# Patient Record
Sex: Female | Born: 1990
Health system: Southern US, Community
[De-identification: ages and names within clinical notes are randomized; demographics above are authoritative.]

## PROBLEM LIST (undated history)

## (undated) DIAGNOSIS — I10 Essential (primary) hypertension: Secondary | ICD-10-CM

## (undated) DIAGNOSIS — F99 Mental disorder, not otherwise specified: Secondary | ICD-10-CM

## (undated) HISTORY — DX: Mental disorder, not otherwise specified: F99

## (undated) HISTORY — DX: Essential (primary) hypertension: I10

---
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
## 2011-10-27 IMAGING — US US ABDOMEN COMPLETE
1 series · 14 of 25 positions shown · non-contrast
Comparison: None.

CLINICAL DATA: Right upper quadrant pain.  Nausea and vomiting.

COMPLETE ABDOMINAL ULTRASOUND

[Series 1: us abdomen complete · 0.26mm/px · 14 of 99 slices shown]
[im 1/99]
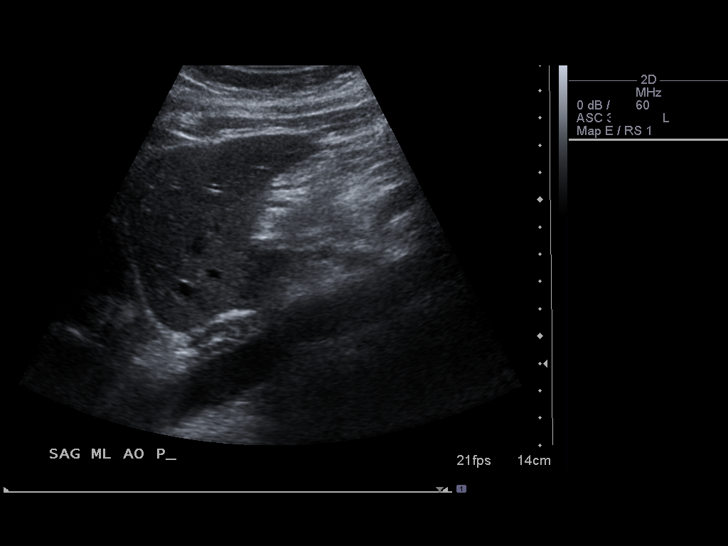
[im 9/99]
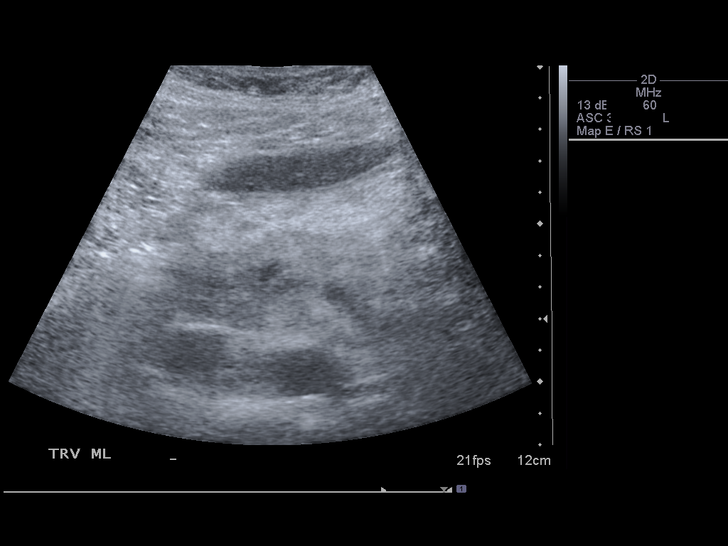
[im 17/99]
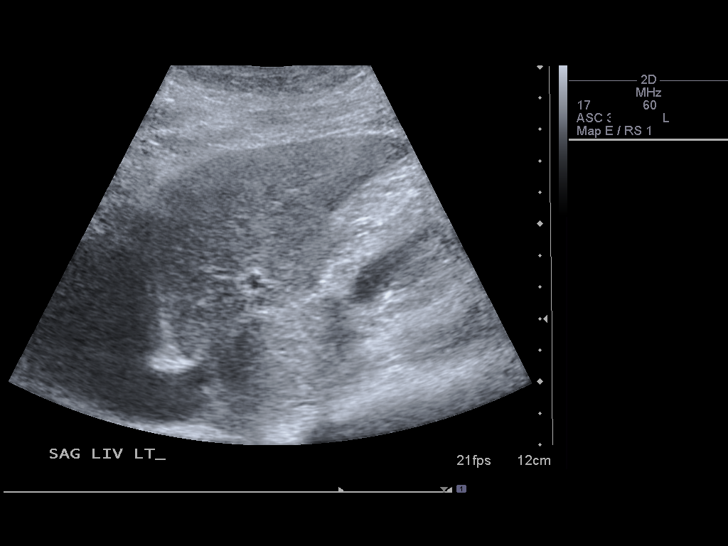
[im 25/99]
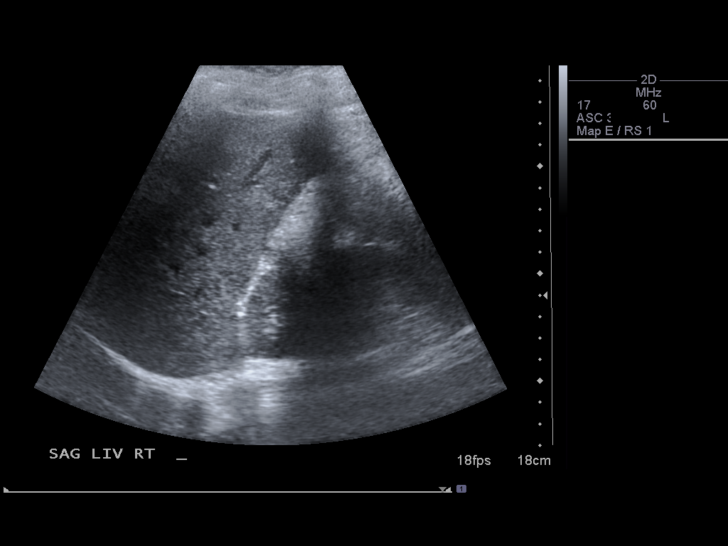
[im 33/99]
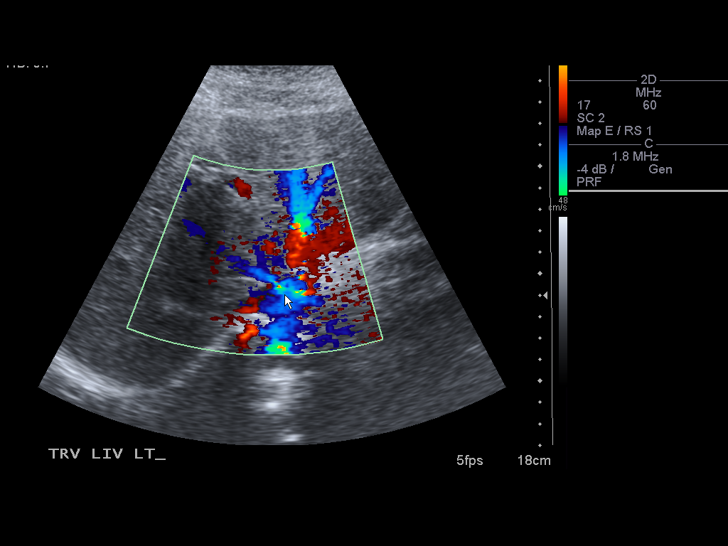
[im 37/99]
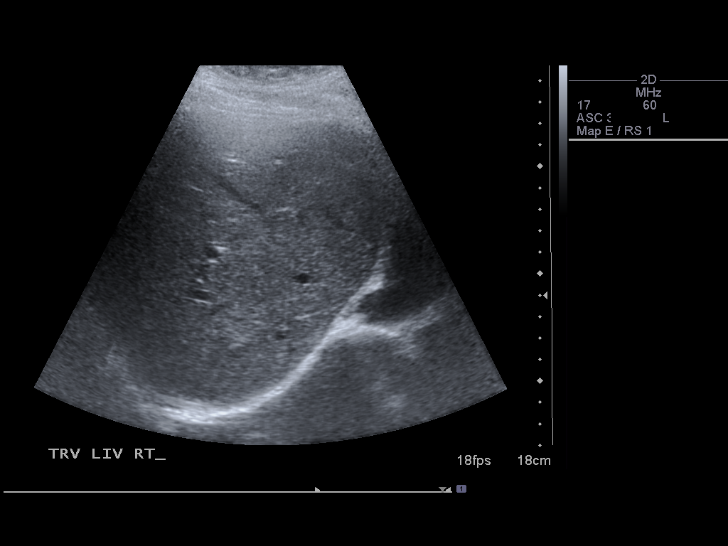
[im 45/99]
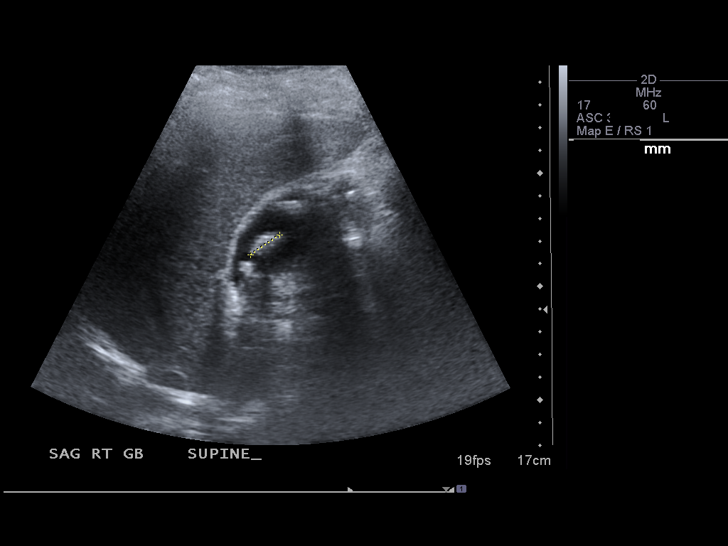
[im 54/99]
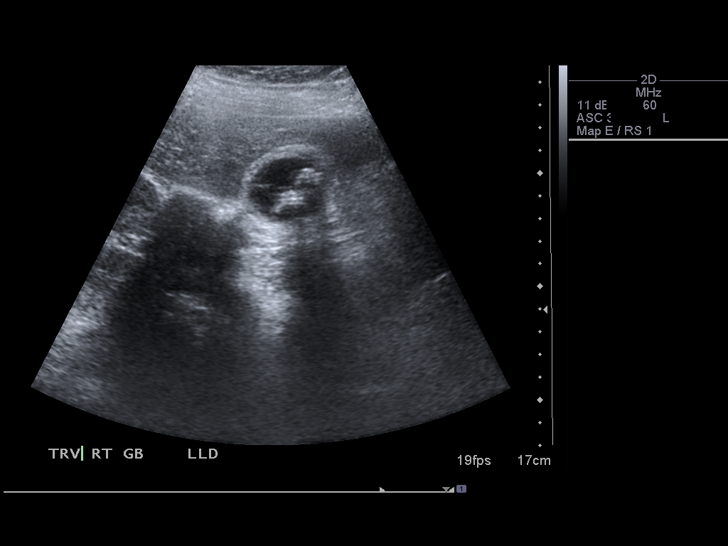
[im 62/99]
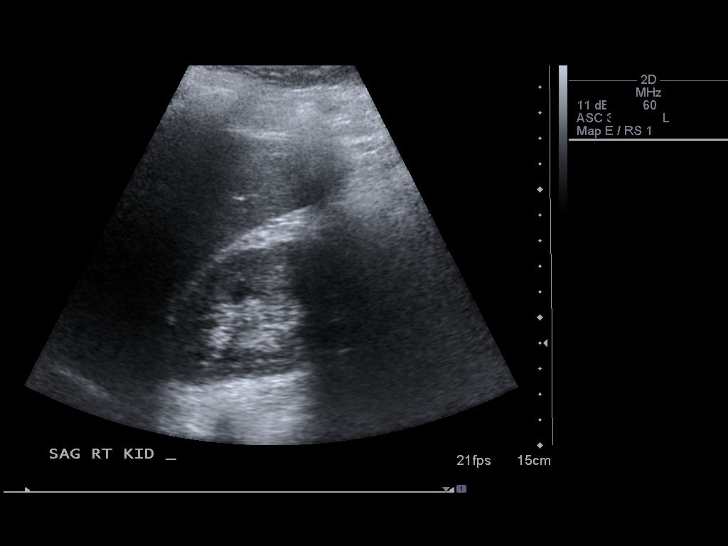
[im 66/99]
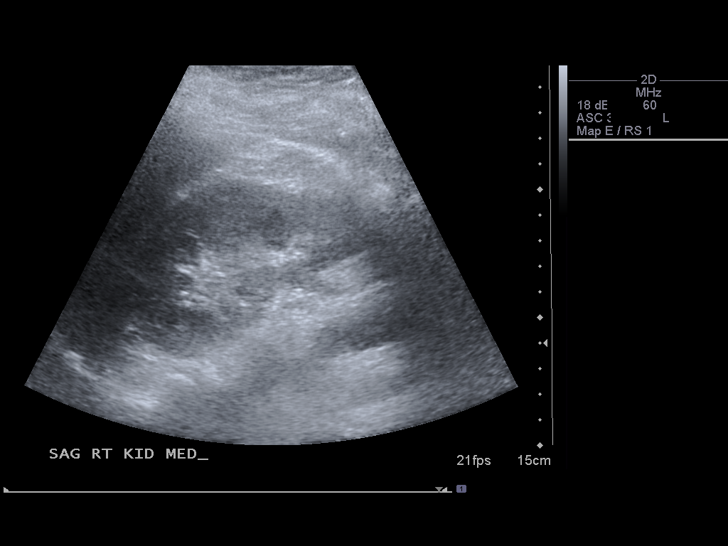
[im 74/99]
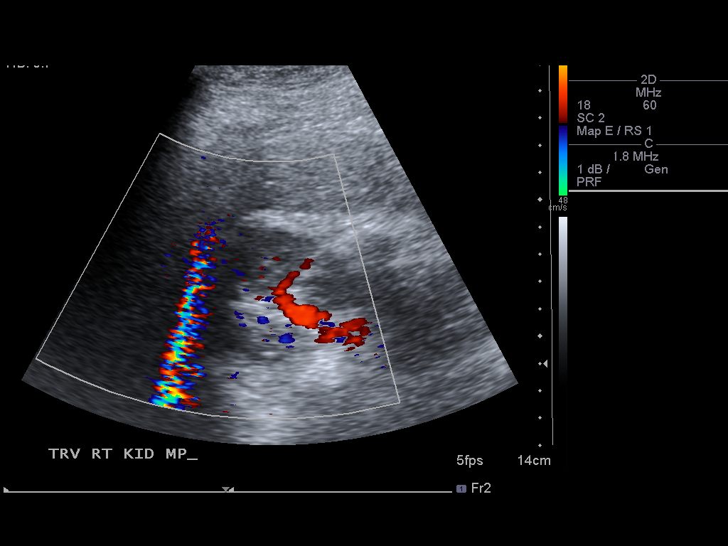
[im 82/99]
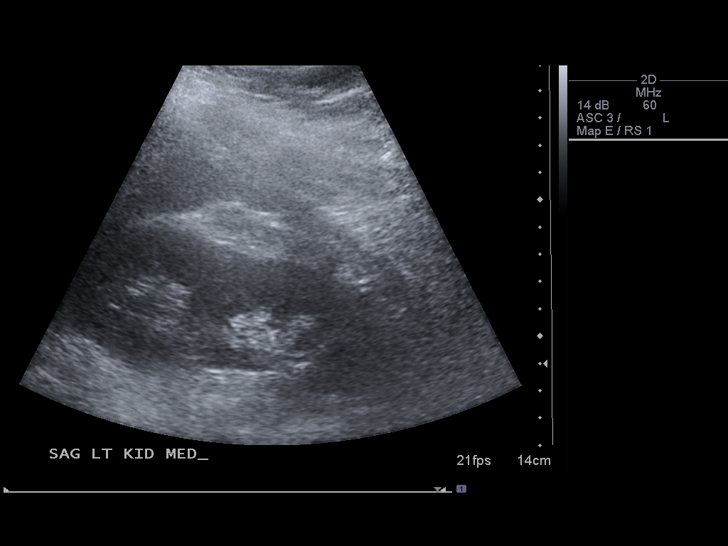
[im 90/99]
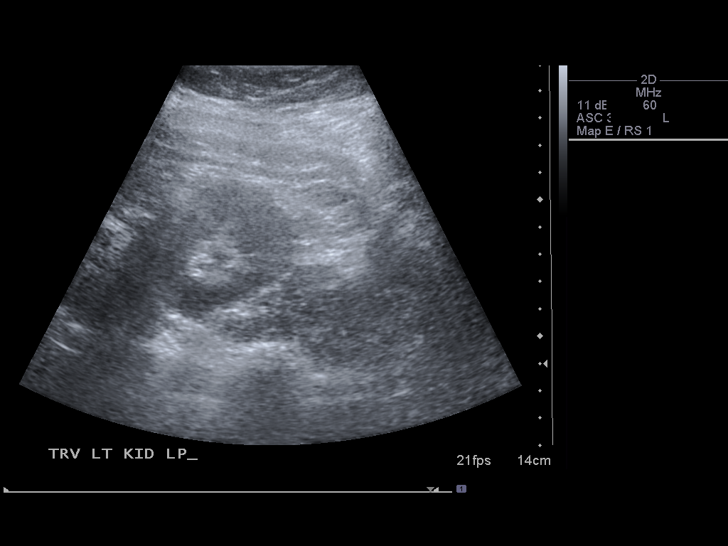
[im 99/99]
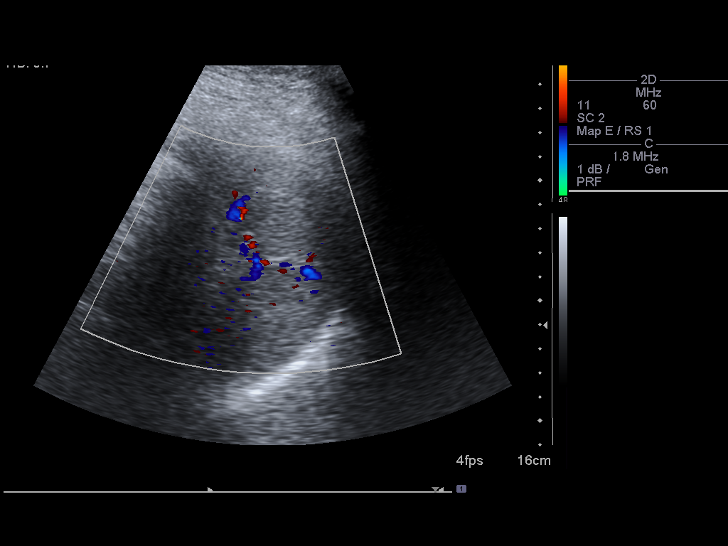

[14 of 25 positions shown; findings below may reference images not displayed]

FINDINGS: Gallbladder:  Multiple stones are identified within the gallbladder
measuring up to 1.6 cm. There appears be a stone impacted in the
neck of the gallbladder. Gallbladder wall is thickened at 0.6 cm.
There may be a small amount of pericholecystic fluid.  Sonographer
reports a positive Murphy's sign.

Common bile duct:  Measures 0.5 cm.

Liver:  No focal lesion or intrahepatic biliary ductal dilatation.
Demonstrates increased echogenicity.

IVC:  Appears normal.

Pancreas:  No focal abnormality seen.

Spleen:  Measures 7.2 cm and appears normal.

Right Kidney:  Measures 11.6 cm and appears normal.

Left Kidney:  Measures 11.2 cm and appears normal.

Abdominal aorta:  No aneurysm identified.
IMPRESSION: 1.  Gallstones and findings consistent with acute cholecystitis.
2.  Fatty infiltration of the liver.

## 2013-09-09 ENCOUNTER — Ambulatory Visit (INDEPENDENT_AMBULATORY_CARE_PROVIDER_SITE_OTHER): Payer: BC Managed Care – PPO | Admitting: Family Medicine

## 2013-09-09 ENCOUNTER — Encounter: Payer: Self-pay | Admitting: Family Medicine

## 2013-09-09 VITALS — BP 130/80 | Temp 98.6°F | Ht 66.0 in | Wt 233.2 lb

## 2013-09-09 DIAGNOSIS — A084 Viral intestinal infection, unspecified: Secondary | ICD-10-CM

## 2013-09-09 DIAGNOSIS — L708 Other acne: Secondary | ICD-10-CM

## 2013-09-09 DIAGNOSIS — A088 Other specified intestinal infections: Secondary | ICD-10-CM

## 2013-09-09 DIAGNOSIS — L709 Acne, unspecified: Secondary | ICD-10-CM

## 2013-09-09 MED ORDER — ADAPALENE-BENZOYL PEROXIDE 0.1-2.5 % EX GEL: CUTANEOUS | Status: DC

## 2013-09-09 NOTE — Progress Notes (Signed)
   Subjective:    Patient ID: Candace Maxwell, female    DOB: 08/07/1990, 23 y.o.   MRN: [id number]  Diarrhea  This is a new problem. The current episode started yesterday. The problem has been gradually worsening. The stool consistency is described as watery. Associated symptoms include coughing and vomiting. Pertinent negatives include no chills or fever. Nothing aggravates the symptoms. There are no known risk factors. She has tried nothing for the symptoms. The treatment provided no relief.  Patient states she thinks she may have food poisioning.   Patient also concerned with acne she states moderate over the past couple months she relates his usual pustules but no cystic areas no skin scarring  Review of Systems  Constitutional: Negative for fever, chills and activity change.  HENT: Positive for congestion and rhinorrhea. Negative for ear pain.   Eyes: Negative for discharge.  Respiratory: Positive for cough. Negative for shortness of breath and wheezing.   Cardiovascular: Negative for chest pain.  Gastrointestinal: Positive for vomiting and diarrhea.       Objective:   Physical Exam  Nursing note and vitals reviewed. Constitutional: She appears well-developed.  HENT:  Head: Normocephalic.  Nose: Nose normal.  Mouth/Throat: Oropharynx is clear and moist. No oropharyngeal exudate.  Neck: Neck supple.  Cardiovascular: Normal rate and normal heart sounds.   No murmur heard. Pulmonary/Chest: Effort normal and breath sounds normal. She has no wheezes.  Lymphadenopathy:    She has no cervical adenopathy.  Skin: Skin is warm and dry.   on examination she has minimal to mild acne not severe on her for head around the nose and on the chin       Assessment & Plan:  Acute diarrhea probably viral I doubt bacterial no need for stool cultures her blood testing currently OTC measures for the diarrhea  Mild acne-medication prescribed. If this doesn't help may have to do additional  medications hold off on birth control pills and oral antibiotics currently

## 2013-09-24 ENCOUNTER — Telehealth: Payer: Self-pay | Admitting: Family Medicine

## 2013-09-24 NOTE — Telephone Encounter (Signed)
Rx prior auth obtained for pt's EPIDUO, expires 09/24/14 through [employer], [facility]

## 2013-11-15 ENCOUNTER — Encounter: Payer: Self-pay | Admitting: Nurse Practitioner

## 2013-11-15 ENCOUNTER — Ambulatory Visit (INDEPENDENT_AMBULATORY_CARE_PROVIDER_SITE_OTHER): Payer: BC Managed Care – PPO | Admitting: Nurse Practitioner

## 2013-11-15 VITALS — BP 100/70 | Temp 98.5°F | Ht 66.0 in | Wt 240.2 lb

## 2013-11-15 DIAGNOSIS — H1010 Acute atopic conjunctivitis, unspecified eye: Secondary | ICD-10-CM

## 2013-11-15 DIAGNOSIS — J309 Allergic rhinitis, unspecified: Secondary | ICD-10-CM

## 2013-11-15 DIAGNOSIS — H1045 Other chronic allergic conjunctivitis: Secondary | ICD-10-CM

## 2013-11-15 NOTE — Patient Instructions (Signed)
Allegra and Claritin as directed Sudafed as directed Zaditor eye drop as directed Nasacort AQ as directed

## 2013-11-17 ENCOUNTER — Encounter: Payer: Self-pay | Admitting: Nurse Practitioner

## 2013-11-17 DIAGNOSIS — J309 Allergic rhinitis, unspecified: Secondary | ICD-10-CM | POA: Insufficient documentation

## 2013-11-17 NOTE — Progress Notes (Signed)
Subjective:  Presents for complaints of itchy watery eyes and sneezing over the past week. Occasional cough. Clear runny nose. No fever. No headache. No wheezing. No sore throat or ear pain. Minimal relief with OTC antihistamine. Has a history of seasonal allergies, much worse this year.  Objective:   BP 100/70  Temp(Src) 98.5 F (36.9 C) (Oral)  Ht 5\' 6"  (1.676 m)  Wt 240 lb 4 oz (108.977 kg)  BMI 38.80 kg/m2 NAD. Alert, oriented. TMs mild clear effusion,no erythema. Nasal mucosa pale and boggy. Conjunctiva minimally injected. No preauricular adenopathy. Pharynx clear. Neck supple with mild anterior adenopathy. Lungs clear. Heart regular rate rhythm.  Assessment: Allergic rhinitis  Allergic conjunctivitis  Plan:Allegra and Claritin as directed Sudafed as directed Zaditor eye drop as directed Nasacort AQ as directed Call back if worsens or persists. Avoid excessive exposure to allergens.

## 2014-10-21 ENCOUNTER — Other Ambulatory Visit (HOSPITAL_COMMUNITY)
Admission: RE | Admit: 2014-10-21 | Discharge: 2014-10-21 | Disposition: A | Discharge status: Home / Self Care | Payer: 59 | Source: Ambulatory Visit | Attending: Obstetrics & Gynecology | Admitting: Obstetrics & Gynecology

## 2014-10-21 ENCOUNTER — Ambulatory Visit (INDEPENDENT_AMBULATORY_CARE_PROVIDER_SITE_OTHER): Payer: 59 | Admitting: Obstetrics & Gynecology

## 2014-10-21 ENCOUNTER — Encounter: Payer: Self-pay | Admitting: Obstetrics & Gynecology

## 2014-10-21 VITALS — BP 120/90 | HR 84 | Ht 65.0 in | Wt 250.0 lb

## 2014-10-21 DIAGNOSIS — Z01419 Encounter for gynecological examination (general) (routine) without abnormal findings: Secondary | ICD-10-CM | POA: Diagnosis not present

## 2014-10-21 DIAGNOSIS — Z3202 Encounter for pregnancy test, result negative: Secondary | ICD-10-CM

## 2014-10-21 LAB — POCT URINE PREGNANCY: Preg Test, Ur: NEGATIVE

## 2014-10-21 MED ORDER — MEDROXYPROGESTERONE ACETATE 10 MG PO TABS: 10.0000 mg | ORAL_TABLET | Freq: Every day | ORAL | Status: DC

## 2014-10-21 MED ORDER — DESOGESTREL-ETHINYL ESTRADIOL 0.15-30 MG-MCG PO TABS: 1.0000 | ORAL_TABLET | Freq: Every day | ORAL | Status: DC

## 2014-10-21 NOTE — Progress Notes (Signed)
Patient ID: Candace BostonJakeira S Heffner, female   DOB: 10/10/1990, 24 y.o.   MRN: [id number] Subjective:     Candace Maxwell is a 24 y.o. female here for a routine exam.  Patient's last menstrual period was 04/04/2014. No obstetric history on file. Birth Control Method:  none Menstrual Calendar(currently): irregualr, last period 03/2014  Current complaints: none.   Current acute medical issues:  none   Recent Gynecologic History Patient's last menstrual period was 04/04/2014. Last Pap: 2015,  normal Last mammogram: ,    History reviewed. No pertinent past medical history.  Past Surgical History  Procedure Laterality Date  . Cholecystectomy  1 1 2012    OB History    No data available      History   Social History  . Marital Status: Single    Spouse Name: N/A  . Number of Children: N/A  . Years of Education: N/A   Social History Main Topics  . Smoking status: Never Smoker   . Smokeless tobacco: Not on file  . Alcohol Use: Not on file  . Drug Use: Not on file  . Sexual Activity: Not on file   Other Topics Concern  . None   Social History Narrative    History reviewed. No pertinent family history.   Current outpatient prescriptions:  .  Adapalene-Benzoyl Peroxide 0.1-2.5 % gel, Apply at bedtime (Patient not taking: Reported on 10/21/2014), Disp: 45 g, Rfl: 0  Review of Systems  Review of Systems  Constitutional: Negative for fever, chills, weight loss, malaise/fatigue and diaphoresis.  HENT: Negative for hearing loss, ear pain, nosebleeds, congestion, sore throat, neck pain, tinnitus and ear discharge.   Eyes: Negative for blurred vision, double vision, photophobia, pain, discharge and redness.  Respiratory: Negative for cough, hemoptysis, sputum production, shortness of breath, wheezing and stridor.   Cardiovascular: Negative for chest pain, palpitations, orthopnea, claudication, leg swelling and PND.  Gastrointestinal: negative for abdominal pain. Negative for  heartburn, nausea, vomiting, diarrhea, constipation, blood in stool and melena.  Genitourinary: Negative for dysuria, urgency, frequency, hematuria and flank pain.  Musculoskeletal: Negative for myalgias, back pain, joint pain and falls.  Skin: Negative for itching and rash.  Neurological: Negative for dizziness, tingling, tremors, sensory change, speech change, focal weakness, seizures, loss of consciousness, weakness and headaches.  Endo/Heme/Allergies: Negative for environmental allergies and polydipsia. Does not bruise/bleed easily.  Psychiatric/Behavioral: Negative for depression, suicidal ideas, hallucinations, memory loss and substance abuse. The patient is not nervous/anxious and does not have insomnia.        Objective:  Blood pressure 120/90, pulse 84, height 5\' 5"  (1.651 m), weight 250 lb (113.399 kg), last menstrual period 04/04/2014.   Physical Exam  Vitals reviewed. Constitutional: She is oriented to person, place, and time. She appears well-developed and well-nourished.  HENT:  Head: Normocephalic and atraumatic.        Right Ear: External ear normal.  Left Ear: External ear normal.  Nose: Nose normal.  Mouth/Throat: Oropharynx is clear and moist.  Eyes: Conjunctivae and EOM are normal. Pupils are equal, round, and reactive to light. Right eye exhibits no discharge. Left eye exhibits no discharge. No scleral icterus.  Neck: Normal range of motion. Neck supple. No tracheal deviation present. No thyromegaly present.  Cardiovascular: Normal rate, regular rhythm, normal heart sounds and intact distal pulses.  Exam reveals no gallop and no friction rub.   No murmur heard. Respiratory: Effort normal and breath sounds normal. No respiratory distress. She has no wheezes. She has  no rales. She exhibits no tenderness.  GI: Soft. Bowel sounds are normal. She exhibits no distension and no mass. There is no tenderness. There is no rebound and no guarding.  Genitourinary:  Breasts no  masses skin changes or nipple changes bilaterally      Vulva is normal without lesions Vagina is pink moist without discharge Cervix normal in appearance and pap is done Uterus is normal size shape and contour Adnexa is negative with normal sized ovaries   Musculoskeletal: Normal range of motion. She exhibits no edema and no tenderness.  Neurological: She is alert and oriented to person, place, and time. She has normal reflexes. She displays normal reflexes. No cranial nerve deficit. She exhibits normal muscle tone. Coordination normal.  Skin: Skin is warm and dry. No rash noted. No erythema. No pallor.  Psychiatric: She has a normal mood and affect. Her behavior is normal. Judgment and thought content normal.       Assessment:    Healthy female exam.   Anovulation Plan:    Contraception: OCP (estrogen/progesterone). Follow up in: 1 year.

## 2014-10-21 NOTE — Addendum Note (Signed)
Addended by: Criss AlvinePULLIAM, CHRYSTAL G on: 10/21/2014 11:47 AM   Modules accepted: Orders

## 2014-10-22 LAB — CYTOLOGY - PAP

## 2014-11-04 ENCOUNTER — Ambulatory Visit: Payer: 59 | Admitting: Obstetrics & Gynecology

## 2014-11-27 ENCOUNTER — Other Ambulatory Visit: Payer: Self-pay | Admitting: Family Medicine

## 2015-05-21 ENCOUNTER — Telehealth: Payer: Self-pay | Admitting: Family Medicine

## 2015-05-21 ENCOUNTER — Ambulatory Visit: Payer: 59 | Admitting: Adult Health

## 2015-05-21 MED ORDER — FLUCONAZOLE 150 MG PO TABS: ORAL_TABLET | ORAL | Status: DC

## 2015-05-21 NOTE — Telephone Encounter (Signed)
Pt called stating that she has a yeast infection and would like for something to be called in. Pt would like the pill to be called in instead of the cream.   [facility]

## 2015-05-21 NOTE — Telephone Encounter (Signed)
Left message on voicemail notifying patient that med was sent to pharmacy per protocol.

## 2015-09-08 ENCOUNTER — Telehealth: Payer: Self-pay | Admitting: Family Medicine

## 2015-09-08 MED ORDER — SULFACETAMIDE SODIUM 10 % OP SOLN: OPHTHALMIC | Status: DC

## 2015-09-08 NOTE — Telephone Encounter (Signed)
Pt is requesting something to be called in for pink eye.    [facility]

## 2015-09-08 NOTE — Telephone Encounter (Signed)
[facility] 09/08/15 

## 2015-09-08 NOTE — Telephone Encounter (Signed)
Spoke with patient to discuss symptoms.Patient stated that eye is red, itching, with yellow drainage. Eye drops sent in per protocol to [facility]. Patient verbalized understanding.

## 2015-10-22 ENCOUNTER — Other Ambulatory Visit (HOSPITAL_COMMUNITY)
Admission: RE | Admit: 2015-10-22 | Discharge: 2015-10-22 | Disposition: A | Discharge status: Home / Self Care | Payer: 59 | Source: Ambulatory Visit | Attending: Obstetrics & Gynecology | Admitting: Obstetrics & Gynecology

## 2015-10-22 ENCOUNTER — Ambulatory Visit (INDEPENDENT_AMBULATORY_CARE_PROVIDER_SITE_OTHER): Payer: 59 | Admitting: Obstetrics & Gynecology

## 2015-10-22 ENCOUNTER — Encounter: Payer: Self-pay | Admitting: Obstetrics & Gynecology

## 2015-10-22 VITALS — BP 120/90 | HR 80 | Ht 65.0 in | Wt 237.4 lb

## 2015-10-22 DIAGNOSIS — Z01419 Encounter for gynecological examination (general) (routine) without abnormal findings: Secondary | ICD-10-CM

## 2015-10-22 MED ORDER — DESOGESTREL-ETHINYL ESTRADIOL 0.15-30 MG-MCG PO TABS: 1.0000 | ORAL_TABLET | Freq: Every day | ORAL | Status: DC

## 2015-10-22 NOTE — Addendum Note (Signed)
Addended by: Federico FlakeNES, PEGGY A on: 10/22/2015 10:02 AM   Modules accepted: Orders

## 2015-10-22 NOTE — Progress Notes (Signed)
Patient ID: Candace Maxwell, female   DOB: 01-20-1991, 25 y.o.   MRN: [id number] Subjective:     Candace Maxwell is a 25 y.o. female here for a routine exam.  Patient's last menstrual period was 09/20/2015. No obstetric history on file. Birth Control Method:  OCP Menstrual Calendar(currently): regular  Current complaints: none.   Current acute medical issues:  none   Recent Gynecologic History Patient's last menstrual period was 09/20/2015. Last Pap: 2016,  normal Last mammogram: na,    History reviewed. No pertinent past medical history.  Past Surgical History  Procedure Laterality Date  . Cholecystectomy  1 1 2012    OB History    No data available      Social History   Social History  . Marital Status: Single    Spouse Name: N/A  . Number of Children: N/A  . Years of Education: N/A   Social History Main Topics  . Smoking status: Never Smoker   . Smokeless tobacco: None  . Alcohol Use: None  . Drug Use: None  . Sexual Activity: Not Asked   Other Topics Concern  . None   Social History Narrative    History reviewed. No pertinent family history.   Current outpatient prescriptions:  .  desogestrel-ethinyl estradiol (APRI,EMOQUETTE,SOLIA) 0.15-30 MG-MCG tablet, Take 1 tablet by mouth daily., Disp: 1 Package, Rfl: 11 .  EPIDUO 0.1-2.5 % gel, APPLY AT BEDTIME., Disp: 45 g, Rfl: 4  Review of Systems  Review of Systems  Constitutional: Negative for fever, chills, weight loss, malaise/fatigue and diaphoresis.  HENT: Negative for hearing loss, ear pain, nosebleeds, congestion, sore throat, neck pain, tinnitus and ear discharge.   Eyes: Negative for blurred vision, double vision, photophobia, pain, discharge and redness.  Respiratory: Negative for cough, hemoptysis, sputum production, shortness of breath, wheezing and stridor.   Cardiovascular: Negative for chest pain, palpitations, orthopnea, claudication, leg swelling and PND.  Gastrointestinal: negative for  abdominal pain. Negative for heartburn, nausea, vomiting, diarrhea, constipation, blood in stool and melena.  Genitourinary: Negative for dysuria, urgency, frequency, hematuria and flank pain.  Musculoskeletal: Negative for myalgias, back pain, joint pain and falls.  Skin: Negative for itching and rash.  Neurological: Negative for dizziness, tingling, tremors, sensory change, speech change, focal weakness, seizures, loss of consciousness, weakness and headaches.  Endo/Heme/Allergies: Negative for environmental allergies and polydipsia. Does not bruise/bleed easily.  Psychiatric/Behavioral: Negative for depression, suicidal ideas, hallucinations, memory loss and substance abuse. The patient is not nervous/anxious and does not have insomnia.        Objective:  Blood pressure 120/90, pulse 80, height  (1.651 m), weight 237 lb 6.4 oz (107.684 kg), last menstrual period 09/20/2015.   Physical Exam  Vitals reviewed. Constitutional: She is oriented to person, place, and time. She appears well-developed and well-nourished.  HENT:  Head: Normocephalic and atraumatic.        Right Ear: External ear normal.  Left Ear: External ear normal.  Nose: Nose normal.  Mouth/Throat: Oropharynx is clear and moist.  Eyes: Conjunctivae and EOM are normal. Pupils are equal, round, and reactive to light. Right eye exhibits no discharge. Left eye exhibits no discharge. No scleral icterus.  Neck: Normal range of motion. Neck supple. No tracheal deviation present. No thyromegaly present.  Cardiovascular: Normal rate, regular rhythm, normal heart sounds and intact distal pulses.  Exam reveals no gallop and no friction rub.   No murmur heard. Respiratory: Effort normal and breath sounds normal. No respiratory distress. She  has no wheezes. She has no rales. She exhibits no tenderness.  GI: Soft. Bowel sounds are normal. She exhibits no distension and no mass. There is no tenderness. There is no rebound and no  guarding.  Genitourinary:  Breasts no masses skin changes or nipple changes bilaterally      Vulva is normal without lesions Vagina is pink moist without discharge Cervix normal in appearance and pap is done Uterus is normal size shape and contour Adnexa is negative with normal sized ovaries   Musculoskeletal: Normal range of motion. She exhibits no edema and no tenderness.  Neurological: She is alert and oriented to person, place, and time. She has normal reflexes. She displays normal reflexes. No cranial nerve deficit. She exhibits normal muscle tone. Coordination normal.  Skin: Skin is warm and dry. No rash noted. No erythema. No pallor.  Psychiatric: She has a normal mood and affect. Her behavior is normal. Judgment and thought content normal.       Assessment:    Healthy female exam.    Plan:    Contraception: OCP (estrogen/progesterone).    Follow up 1 year  Meds ordered this encounter  Medications  . desogestrel-ethinyl estradiol (APRI,EMOQUETTE,SOLIA) 0.15-30 MG-MCG tablet    Sig: Take 1 tablet by mouth daily.    Dispense:  1 Package    Refill:  11    No orders of the defined types were placed in this encounter.

## 2015-10-23 LAB — CYTOLOGY - PAP

## 2015-11-10 ENCOUNTER — Encounter: Payer: Self-pay | Admitting: Family Medicine

## 2015-11-10 ENCOUNTER — Ambulatory Visit (INDEPENDENT_AMBULATORY_CARE_PROVIDER_SITE_OTHER): Payer: 59 | Admitting: Family Medicine

## 2015-11-10 VITALS — BP 110/80 | Temp 99.0°F | Ht 66.0 in | Wt 232.0 lb

## 2015-11-10 DIAGNOSIS — J4520 Mild intermittent asthma, uncomplicated: Secondary | ICD-10-CM

## 2015-11-10 DIAGNOSIS — J329 Chronic sinusitis, unspecified: Secondary | ICD-10-CM

## 2015-11-10 MED ORDER — ALBUTEROL SULFATE HFA 108 (90 BASE) MCG/ACT IN AERS: 2.0000 | INHALATION_SPRAY | Freq: Four times a day (QID) | RESPIRATORY_TRACT | Status: AC | PRN

## 2015-11-10 MED ORDER — AMOXICILLIN-POT CLAVULANATE 875-125 MG PO TABS: 1.0000 | ORAL_TABLET | Freq: Two times a day (BID) | ORAL | Status: AC

## 2015-11-10 NOTE — Progress Notes (Signed)
   Subjective:    Patient ID: Candace Maxwell, female    DOB: 08/04/1990, 25 y.o.   MRN: [id number]  Cough This is a new problem. The current episode started 1 to 4 weeks ago. The problem has been unchanged. Associated symptoms include ear pain and wheezing. Nothing aggravates the symptoms. Treatments tried: otc medicines. The treatment provided no relief.  no cough in the family  Pt has sig hair, handful, no noticeable baldness or alopecia, teo mo dur    Patient has been having hair loss for about 2 months now. She would like to talk with the doctor about this today.    Review of Systems  HENT: Positive for ear pain.   Respiratory: Positive for cough and wheezing.        Objective:   Physical Exam  alert vital stable bilateral wax pharynx normal neck supple lungs bronchial cough occasional wheeze heart rare rhythm no tachypnea plus minus frontal tenderness scalp completely normal       Assessment & Plan:   impression 1 rhinosinusitis element of bronchitis reactive airways #2 allergic rhinitis discussed #3 substantial wax bilateral plan Debrox drops. Antibiotics prescribed. Albuterol when necessary WSL

## 2015-11-10 NOTE — Patient Instructions (Signed)
Try debrox drops for the was in your ers

## 2016-02-29 ENCOUNTER — Emergency Department (HOSPITAL_COMMUNITY)
Admission: EM | Admit: 2016-02-29 | Discharge: 2016-02-29 | Disposition: A | Discharge status: Home / Self Care | Payer: 59 | Attending: Emergency Medicine | Admitting: Emergency Medicine

## 2016-02-29 ENCOUNTER — Encounter (HOSPITAL_COMMUNITY): Payer: Self-pay | Admitting: Emergency Medicine

## 2016-02-29 DIAGNOSIS — R300 Dysuria: Secondary | ICD-10-CM

## 2016-02-29 DIAGNOSIS — E860 Dehydration: Secondary | ICD-10-CM | POA: Diagnosis not present

## 2016-02-29 DIAGNOSIS — Z79899 Other long term (current) drug therapy: Secondary | ICD-10-CM | POA: Insufficient documentation

## 2016-02-29 DIAGNOSIS — R110 Nausea: Secondary | ICD-10-CM | POA: Insufficient documentation

## 2016-02-29 LAB — URINALYSIS, ROUTINE W REFLEX MICROSCOPIC
GLUCOSE, UA: NEGATIVE mg/dL
HGB URINE DIPSTICK: NEGATIVE
LEUKOCYTES UA: NEGATIVE
Nitrite: NEGATIVE
PH: 6 (ref 5.0–8.0)
PROTEIN: 30 mg/dL — AB

## 2016-02-29 LAB — URINE MICROSCOPIC-ADD ON: RBC / HPF: NONE SEEN RBC/hpf (ref 0–5)

## 2016-02-29 LAB — POC URINE PREG, ED: PREG TEST UR: NEGATIVE

## 2016-02-29 MED ORDER — SULFAMETHOXAZOLE-TRIMETHOPRIM 800-160 MG PO TABS: 1.0000 | ORAL_TABLET | Freq: Two times a day (BID) | ORAL | 0 refills | Status: AC

## 2016-02-29 MED ORDER — SULFAMETHOXAZOLE-TRIMETHOPRIM 800-160 MG PO TABS
1.0000 | ORAL_TABLET | Freq: Once | ORAL | Status: AC
  Administered 2016-02-29: 1 via ORAL
  Filled 2016-02-29: qty 1

## 2016-02-29 NOTE — ED Notes (Signed)
Patient verbalizes understanding of discharge instructions, prescriptions, home care and follow up care. Patient out of department at this time. 

## 2016-02-29 NOTE — ED Triage Notes (Signed)
Pt c/o pain when urinating since Friday.

## 2016-02-29 NOTE — ED Provider Notes (Signed)
[facility] Provider Note   CSN: [id number] Arrival date & time: 02/29/16  2010  First Provider Contact:  First MD Initiated Contact with Patient 02/29/16 2024     By signing my name below, I, Vista Mink, attest that this documentation has been prepared under the direction and in the presence of Zadie Rhine, MD. Electronically signed, Vista Mink, ED Scribe. 02/29/16. 8:38 PM.   History   Chief Complaint Chief Complaint  Patient presents with  . Dysuria    HPI HPI Comments: Candace Maxwell is a 25 y.o. Female with a PMHx of Cholecystectomy, who presents to the Emergency Department complaining of persistent, unchanged, dysuria that started four days ago. Pt reports a burning pain every time she has urinated in the past five days. Pt states that the pain hurts to the point that she feels nauseas. Pt reports that she had a yeast infection approximately 4 months ago. Pt states she made an appointment with her OBGYN doctor in three days; 03/03/16. Pt further states that she did not think she was going to be able to wait until her appointment so she came to the ED for evaluation. Pt denies Hx of UTI. Denies fever, vomiting, back pain, vaginal bleeding or discharge, abdominal pain.   The history is provided by the patient. No language interpreter was used.  Dysuria   This is a new problem. The current episode started more than 2 days ago. The problem occurs every urination. The problem has not changed since onset.The quality of the pain is described as burning. There has been no fever. Associated symptoms include nausea. Pertinent negatives include no vomiting, no discharge and no flank pain. She has tried nothing for the symptoms.    History reviewed. No pertinent past medical history.  Patient Active Problem List   Diagnosis Date Noted  . Allergic rhinitis 11/17/2013  . Acne 09/09/2013    Past Surgical History:  Procedure Laterality Date  . CHOLECYSTECTOMY  1 1 2012     OB History    No data available       Home Medications    Prior to Admission medications   Medication Sig Start Date End Date Taking? Authorizing Provider  albuterol (PROVENTIL HFA;VENTOLIN HFA) 108 (90 Base) MCG/ACT inhaler Inhale 2 puffs into the lungs every 6 (six) hours as needed for wheezing or shortness of breath. 11/10/15   Merlyn Albert, MD  desogestrel-ethinyl estradiol (APRI,EMOQUETTE,SOLIA) 0.15-30 MG-MCG tablet Take 1 tablet by mouth daily. 10/22/15   Lazaro Arms, MD  EPIDUO 0.1-2.5 % gel APPLY AT BEDTIME. 11/27/14   Babs Sciara, MD    Family History History reviewed. No pertinent family history.  Social History Social History  Substance Use Topics  . Smoking status: Never Smoker  . Smokeless tobacco: Never Used  . Alcohol use Not on file     Allergies   Review of patient's allergies indicates no known allergies.   Review of Systems Review of Systems  Constitutional: Negative for fever.  Respiratory: Negative for shortness of breath.   Cardiovascular: Negative for chest pain and palpitations.  Gastrointestinal: Positive for nausea. Negative for abdominal pain and vomiting.  Genitourinary: Positive for dysuria. Negative for flank pain, vaginal bleeding and vaginal discharge.  All other systems reviewed and are negative.    Physical Exam Updated Vital Signs BP 138/91   Pulse (!) 122   Temp 98 F (36.7 C)   Resp 16   Ht  (1.651 m)   Wt  225 lb (102.1 kg)   LMP 02/15/2016   SpO2 100%   BMI 37.44 kg/m   Physical Exam CONSTITUTIONAL: Well developed/well nourished HEAD: Normocephalic/atraumatic EYES: EOMI/PERRL ENMT: Mucous membranes moist NECK: supple no meningeal signs SPINE/BACK:entire spine nontender CV: S1/S2 noted, no murmurs/rubs/gallops noted LUNGS: Lungs are clear to auscultation bilaterally, no apparent distress ABDOMEN: soft, nontender, no rebound or guarding, bowel sounds noted throughout abdomen GU:no cva  tenderness NEURO: Pt is awake/alert/appropriate, moves all extremitiesx4.  No facial droop.   EXTREMITIES: pulses normal/equal, full ROM SKIN: warm, color normal PSYCH: no abnormalities of mood noted, alert and oriented to situation   ED Treatments / Results  DIAGNOSTIC STUDIES: Oxygen Saturation is 100% on RA, normal by my interpretation.  COORDINATION OF CARE: 8:27 PM-Will wait for UA results. Discussed treatment plan with pt at bedside and pt agreed to plan.   Labs (all labs ordered are listed, but only abnormal results are displayed) Labs Reviewed  URINALYSIS, ROUTINE W REFLEX MICROSCOPIC (NOT AT [facility]) - Abnormal; Notable for the following:       Result Value   Specific Gravity, Urine >1.030 (*)    Bilirubin Urine SMALL (*)    Ketones, ur TRACE (*)    Protein, ur 30 (*)    All other components within normal limits  URINE MICROSCOPIC-ADD ON - Abnormal; Notable for the following:    Squamous Epithelial / LPF 0-5 (*)    Bacteria, UA FEW (*)    All other components within normal limits  POC URINE PREG, ED    EKG  EKG Interpretation None       Radiology No results found.  Procedures Procedures (including critical care time)  Medications Ordered in ED Medications - No data to display   Initial Impression / Assessment and Plan / ED Course  I have reviewed the triage vital signs and the nursing notes.  Pertinent labs & imaging results that were available during my care of the patient were reviewed by me and considered in my medical decision making (see chart for details).  Clinical Course    Pt well appearing No distress She reports dysuria and urinary frequency Clinically appears to be UTI but only has evidence of dehydration Advised to increase her PO fluids Will give short course of bactrim  As for tachycardia, pt appears anxious She otherwise feels well, denies cp/sob No signs of sepsis   Final Clinical Impressions(s) / ED Diagnoses   Final  diagnoses:  Dysuria  Dehydration    New Prescriptions New Prescriptions   SULFAMETHOXAZOLE-TRIMETHOPRIM (BACTRIM DS,SEPTRA DS) 800-160 MG TABLET    Take 1 tablet by mouth 2 (two) times daily.  I personally performed the services described in this documentation, which was scribed in my presence. The recorded information has been reviewed and is accurate.       Zadie Rhineonald Johnaton Sonneborn, MD 02/29/16 2154

## 2016-03-03 ENCOUNTER — Ambulatory Visit: Payer: 59 | Admitting: Obstetrics & Gynecology

## 2016-03-11 ENCOUNTER — Telehealth: Payer: Self-pay | Admitting: Family Medicine

## 2016-03-11 MED ORDER — FLUCONAZOLE 150 MG PO TABS: 150.0000 mg | ORAL_TABLET | Freq: Every day | ORAL | 0 refills | Status: DC

## 2016-03-11 NOTE — Telephone Encounter (Signed)
Pt is needing something called in for a yeast inf.      [facility] [place]

## 2016-03-11 NOTE — Telephone Encounter (Signed)
Per protocol- Diflucan 150 mg 1 tablet po 3 days apart #2 was sent to pharmacy. Patient was notified.

## 2016-07-19 ENCOUNTER — Telehealth: Payer: Self-pay | Admitting: Family Medicine

## 2016-07-19 ENCOUNTER — Other Ambulatory Visit: Payer: Self-pay | Admitting: *Deleted

## 2016-07-19 MED ORDER — FLUCONAZOLE 150 MG PO TABS: 150.0000 mg | ORAL_TABLET | Freq: Every day | ORAL | 0 refills | Status: DC

## 2016-07-19 NOTE — Telephone Encounter (Signed)
Diflucan 150mg  #2 one 3 days apart.  per dr Lorin Picketscott. Med sent to pharm. Pt notified if symptoms not better to follow up with office visit

## 2016-07-19 NOTE — Telephone Encounter (Signed)
Patient is having some burning and vaginal irritation.  She thinks she has a yeast infection and would like Rx called in.  [facility] [place]

## 2016-10-25 ENCOUNTER — Encounter: Payer: Self-pay | Admitting: Obstetrics & Gynecology

## 2016-10-25 ENCOUNTER — Other Ambulatory Visit (HOSPITAL_COMMUNITY)
Admission: RE | Admit: 2016-10-25 | Discharge: 2016-10-25 | Disposition: A | Discharge status: Home / Self Care | Payer: 59 | Source: Ambulatory Visit | Attending: Obstetrics & Gynecology | Admitting: Obstetrics & Gynecology

## 2016-10-25 ENCOUNTER — Ambulatory Visit (INDEPENDENT_AMBULATORY_CARE_PROVIDER_SITE_OTHER): Payer: 59 | Admitting: Obstetrics & Gynecology

## 2016-10-25 VITALS — BP 130/98 | HR 74 | Ht 65.0 in | Wt 243.2 lb

## 2016-10-25 DIAGNOSIS — Z01419 Encounter for gynecological examination (general) (routine) without abnormal findings: Secondary | ICD-10-CM | POA: Diagnosis not present

## 2016-10-25 MED ORDER — DESOGESTREL-ETHINYL ESTRADIOL 0.15-30 MG-MCG PO TABS: 1.0000 | ORAL_TABLET | Freq: Every day | ORAL | 11 refills | Status: DC

## 2016-10-25 NOTE — Progress Notes (Signed)
Subjective:     Candace Maxwell is a 26 y.o. female here for a routine exam.  Patient's last menstrual period was 10/20/2016. No obstetric history on file. Birth Control Method:  none Menstrual Calendar(currently): regular  Current complaints: none.   Current acute medical issues:  none   Recent Gynecologic History Patient's last menstrual period was 10/20/2016. Last Pap: 2017,  normal Last mammogram: ,    History reviewed. No pertinent past medical history.  Past Surgical History:  Procedure Laterality Date  . CHOLECYSTECTOMY  1 1 2012    OB History    No data available      Social History   Social History  . Marital status: Single    Spouse name: N/A  . Number of children: N/A  . Years of education: N/A   Social History Main Topics  . Smoking status: Never Smoker  . Smokeless tobacco: Never Used  . Alcohol use None  . Drug use: Unknown  . Sexual activity: No   Other Topics Concern  . None   Social History Narrative  . None    History reviewed. No pertinent family history.   Current Outpatient Prescriptions:  .  desogestrel-ethinyl estradiol (APRI,EMOQUETTE,SOLIA) 0.15-30 MG-MCG tablet, Take 1 tablet by mouth daily., Disp: 1 Package, Rfl: 11 .  albuterol (PROVENTIL HFA;VENTOLIN HFA) 108 (90 Base) MCG/ACT inhaler, Inhale 2 puffs into the lungs every 6 (six) hours as needed for wheezing or shortness of breath. (Patient not taking: Reported on 10/25/2016), Disp: 1 Inhaler, Rfl: 0 .  EPIDUO 0.1-2.5 % gel, APPLY AT BEDTIME. (Patient not taking: Reported on 10/25/2016), Disp: 45 g, Rfl: 4  Review of Systems  Review of Systems  Constitutional: Negative for fever, chills, weight loss, malaise/fatigue and diaphoresis.  HENT: Negative for hearing loss, ear pain, nosebleeds, congestion, sore throat, neck pain, tinnitus and ear discharge.   Eyes: Negative for blurred vision, double vision, photophobia, pain, discharge and redness.  Respiratory: Negative for cough,  hemoptysis, sputum production, shortness of breath, wheezing and stridor.   Cardiovascular: Negative for chest pain, palpitations, orthopnea, claudication, leg swelling and PND.  Gastrointestinal: negative for abdominal pain. Negative for heartburn, nausea, vomiting, diarrhea, constipation, blood in stool and melena.  Genitourinary: Negative for dysuria, urgency, frequency, hematuria and flank pain.  Musculoskeletal: Negative for myalgias, back pain, joint pain and falls.  Skin: Negative for itching and rash.  Neurological: Negative for dizziness, tingling, tremors, sensory change, speech change, focal weakness, seizures, loss of consciousness, weakness and headaches.  Endo/Heme/Allergies: Negative for environmental allergies and polydipsia. Does not bruise/bleed easily.  Psychiatric/Behavioral: Negative for depression, suicidal ideas, hallucinations, memory loss and substance abuse. The patient is not nervous/anxious and does not have insomnia.        Objective:  Blood pressure (!) 130/98, pulse 74, height  (1.651 m), weight 243 lb 3.2 oz (110.3 kg), last menstrual period 10/20/2016.   Physical Exam  Vitals reviewed. Constitutional: She is oriented to person, place, and time. She appears well-developed and well-nourished.  HENT:  Head: Normocephalic and atraumatic.        Right Ear: External ear normal.  Left Ear: External ear normal.  Nose: Nose normal.  Mouth/Throat: Oropharynx is clear and moist.  Eyes: Conjunctivae and EOM are normal. Pupils are equal, round, and reactive to light. Right eye exhibits no discharge. Left eye exhibits no discharge. No scleral icterus.  Neck: Normal range of motion. Neck supple. No tracheal deviation present. No thyromegaly present.  Cardiovascular: Normal rate, regular  rhythm, normal heart sounds and intact distal pulses.  Exam reveals no gallop and no friction rub.   No murmur heard. Respiratory: Effort normal and breath sounds normal. No  respiratory distress. She has no wheezes. She has no rales. She exhibits no tenderness.  GI: Soft. Bowel sounds are normal. She exhibits no distension and no mass. There is no tenderness. There is no rebound and no guarding.  Genitourinary:  Breasts no masses skin changes or nipple changes bilaterally      Vulva is normal without lesions Vagina is pink moist without discharge Cervix normal in appearance and pap is done Uterus is normal size shape and contour Adnexa is negative with normal sized ovaries   Musculoskeletal: Normal range of motion. She exhibits no edema and no tenderness.  Neurological: She is alert and oriented to person, place, and time. She has normal reflexes. She displays normal reflexes. No cranial nerve deficit. She exhibits normal muscle tone. Coordination normal.  Skin: Skin is warm and dry. No rash noted. No erythema. No pallor.  Psychiatric: She has a normal mood and affect. Her behavior is normal. Judgment and thought content normal.       Medications Ordered at today's visit: Meds ordered this encounter  Medications  . desogestrel-ethinyl estradiol (APRI,EMOQUETTE,SOLIA) 0.15-30 MG-MCG tablet    Sig: Take 1 tablet by mouth daily.    Dispense:  1 Package    Refill:  11    Other orders placed at today's visit: No orders of the defined types were placed in this encounter.     Assessment:    Healthy female exam.    Plan:    Contraception: none.    Trying to conceive  Return in about 2 years (around 10/26/2018) for yearly.

## 2016-10-27 LAB — CYTOLOGY - PAP: Diagnosis: NEGATIVE

## 2017-02-07 ENCOUNTER — Other Ambulatory Visit: Payer: Self-pay | Admitting: Family Medicine

## 2017-02-07 MED ORDER — FLUCONAZOLE 150 MG PO TABS: 150.0000 mg | ORAL_TABLET | Freq: Every day | ORAL | 0 refills | Status: DC

## 2017-02-07 NOTE — Telephone Encounter (Signed)
Pt is requesting something for a yeast inf to be called in.     [facility] [place]

## 2017-02-07 NOTE — Telephone Encounter (Signed)
Per protocol : fluconazole (DIFLUCAN) 150 MG tablet [[id number]][[id number]]  Order Details  Dose: 150 mg Route: Oral Frequency: Daily  Dispense Quantity:  2 tablet Refills:  0 Fills remaining:  --        Sig: Take 1 tablet (150 mg total) by mouth daily. 3 days apart     Prescription sent electronically to pharmacy. Patient notified

## 2017-03-02 ENCOUNTER — Encounter: Payer: Self-pay | Admitting: Nurse Practitioner

## 2017-03-02 ENCOUNTER — Ambulatory Visit (INDEPENDENT_AMBULATORY_CARE_PROVIDER_SITE_OTHER): Payer: 59 | Admitting: Nurse Practitioner

## 2017-03-02 VITALS — Temp 98.6°F | Ht 66.0 in | Wt 242.0 lb

## 2017-03-02 DIAGNOSIS — B372 Candidiasis of skin and nail: Secondary | ICD-10-CM | POA: Diagnosis not present

## 2017-03-02 MED ORDER — KETOCONAZOLE 2 % EX CREA: 1.0000 "application " | TOPICAL_CREAM | Freq: Two times a day (BID) | CUTANEOUS | 0 refills | Status: DC

## 2017-03-02 MED ORDER — FLUCONAZOLE 150 MG PO TABS: ORAL_TABLET | ORAL | 2 refills | Status: DC

## 2017-03-02 NOTE — Patient Instructions (Signed)
Add diaper rash ointment such as Desitin

## 2017-03-02 NOTE — Progress Notes (Signed)
Subjective:  Presents for an itchy, burning rash in the fold of the skin at the top of the thighs for the past week. No relief with OTC topicals including medicated powders. No fever, pelvic pain or discharge. Is not sexually active; defers need for STD testing. Has recurrent vaginal yeast infections around the time of her cycle which then resolves. [facility]   Objective:   Temp 98.6 F (37 C) (Oral)   Ht 5\' 6"  (1.676 m)   Wt 242 lb 0.2 oz (109.8 kg)   BMI 39.06 kg/m  NAD. Alert, oriented. Confluent mildly erythematous shiny rash in the intertriginous areas bilat with satellite papular lesions.   Assessment:  Intertriginous candidiasis    Plan:   Meds ordered this encounter  Medications  . ketoconazole (NIZORAL) 2 % cream    Sig: Apply 1 application topically 2 (two) times daily.    Dispense:  30 g    Refill:  0    Order Specific Question:   Supervising Provider    Answer:   Merlyn AlbertLUKING, WILLIAM S [[id number]]  . fluconazole (DIFLUCAN) 150 MG tablet    Sig: One po qd prn yeast infection; may repeat in 3-4 days if needed    Dispense:  2 tablet    Refill:  2    Order Specific Question:   Supervising Provider    Answer:   Merlyn AlbertLUKING, WILLIAM S [[id number]]   Use ketoconazole with a barrier cream. Call back in 7-10 d if persists.  Use diflucan around time of her cycle if needed.  Return if symptoms worsen or fail to improve.

## 2017-03-16 ENCOUNTER — Other Ambulatory Visit: Payer: Self-pay | Admitting: Nurse Practitioner

## 2017-03-16 NOTE — Telephone Encounter (Signed)
Last seen 03/02/17 

## 2017-07-27 ENCOUNTER — Encounter: Payer: Self-pay | Admitting: Family Medicine

## 2017-09-09 ENCOUNTER — Other Ambulatory Visit: Payer: Self-pay | Admitting: Nurse Practitioner

## 2017-10-23 ENCOUNTER — Other Ambulatory Visit: Payer: Self-pay | Admitting: Obstetrics & Gynecology

## 2018-05-09 ENCOUNTER — Telehealth: Payer: Self-pay | Admitting: Family Medicine

## 2018-05-09 ENCOUNTER — Other Ambulatory Visit: Payer: Self-pay | Admitting: *Deleted

## 2018-05-09 MED ORDER — FLUCONAZOLE 150 MG PO TABS
ORAL_TABLET | ORAL | 0 refills | Status: DC
Start: 2018-05-09 — End: 2018-12-03

## 2018-05-09 NOTE — Telephone Encounter (Signed)
Requesting refill: fluconazole (DIFLUCAN) 150 MG tablet   [facility]  [address] [place], [place], [place] [address]

## 2018-05-09 NOTE — Telephone Encounter (Signed)
White discharge and vaginal discharge. Diflucan 150mg  one po three days apart #2 per dr Lorin Picket. Med sent to pharm. Pt notified.

## 2018-10-01 ENCOUNTER — Other Ambulatory Visit: Payer: Self-pay | Admitting: Obstetrics & Gynecology

## 2018-10-12 ENCOUNTER — Other Ambulatory Visit: Payer: Self-pay | Admitting: Obstetrics & Gynecology

## 2018-10-29 ENCOUNTER — Other Ambulatory Visit: Payer: 59 | Admitting: Obstetrics & Gynecology

## 2018-11-13 ENCOUNTER — Other Ambulatory Visit: Payer: 59 | Admitting: Obstetrics & Gynecology

## 2018-12-03 ENCOUNTER — Other Ambulatory Visit: Payer: Self-pay

## 2018-12-03 ENCOUNTER — Ambulatory Visit (INDEPENDENT_AMBULATORY_CARE_PROVIDER_SITE_OTHER): Payer: 59 | Admitting: Family Medicine

## 2018-12-03 DIAGNOSIS — J020 Streptococcal pharyngitis: Secondary | ICD-10-CM | POA: Diagnosis not present

## 2018-12-03 MED ORDER — AZITHROMYCIN 250 MG PO TABS: ORAL_TABLET | ORAL | 0 refills | Status: DC

## 2018-12-03 MED ORDER — FLUCONAZOLE 150 MG PO TABS: ORAL_TABLET | ORAL | 0 refills | Status: DC

## 2018-12-03 NOTE — Progress Notes (Signed)
   Subjective:    Patient ID: Candace Maxwell, female    DOB: [birth year], 28 y.o.   MRN: [id number]  Sore Throat   This is a new problem. The current episode started yesterday. The pain is worse on the left side. There has been no fever. She has tried acetaminophen for the symptoms. The treatment provided no relief.    Patient relates significant sore throat worse on the left side along with enlarged tonsils and exudate painful to swallow more so on the left side no sweats or chills wheezing or difficulty breathing no myalgias cough or shortness of breath  PMH benign Virtual Visit via Video Note  I connected with Candace Maxwell on 12/03/18 at 11:30 AM EDT by a video enabled telemedicine application and verified that I am speaking with the correct person using two identifiers.  Location: Patient: home Provider: office   I discussed the limitations of evaluation and management by telemedicine and the availability of in person appointments. The patient expressed understanding and agreed to proceed.  History of Present Illness:    Observations/Objective:   Assessment and Plan:   Follow Up Instructions:    I discussed the assessment and treatment plan with the patient. The patient was provided an opportunity to ask questions and all were answered. The patient agreed with the plan and demonstrated an understanding of the instructions.   The patient was advised to call back or seek an in-person evaluation if the symptoms worsen or if the condition fails to improve as anticipated.  I provided 15 minutes of non-face-to-face time during this encounter.     Review of Systems     Objective:   Physical Exam        Assessment & Plan:  Based upon the history more than likely strep throat I would recommend going ahead with treatment of antibiotics and also recommend warm salt water gargles and if progressive troubles or worse to follow-up

## 2018-12-24 ENCOUNTER — Telehealth: Payer: Self-pay | Admitting: Family Medicine

## 2018-12-24 MED ORDER — AMOXICILLIN 500 MG PO CAPS: 500.0000 mg | ORAL_CAPSULE | Freq: Three times a day (TID) | ORAL | 0 refills | Status: DC

## 2018-12-24 NOTE — Telephone Encounter (Signed)
Pt was seen 5/11 for tonsil stones on left side. She is calling in today stating they are now on the right side and would like something called in to  [id number] - [place], [place] - [address] AT [facility] [address]

## 2018-12-24 NOTE — Telephone Encounter (Signed)
Patient states she is having no complications and is using warm salt water and will try the antibiotic and call back if any further problems. Prescription sent electronically to pharmacy

## 2018-12-24 NOTE — Telephone Encounter (Signed)
Nurses Please make sure the patient's not having complications such as fever or difficulty swallowing-true dysphagia If overall doing well I recommend warm salt water gargles with amoxicillin 500 mg 1 p.o. taken 3 times daily, #30, 10-day, if ongoing troubles or problems it would be important for the patient to be seen

## 2018-12-31 ENCOUNTER — Other Ambulatory Visit: Payer: Self-pay

## 2018-12-31 ENCOUNTER — Encounter: Payer: Self-pay | Admitting: Obstetrics & Gynecology

## 2018-12-31 ENCOUNTER — Other Ambulatory Visit (HOSPITAL_COMMUNITY)
Admission: RE | Admit: 2018-12-31 | Discharge: 2018-12-31 | Disposition: A | Discharge status: Home / Self Care | Payer: 59 | Source: Ambulatory Visit | Attending: Obstetrics & Gynecology | Admitting: Obstetrics & Gynecology

## 2018-12-31 ENCOUNTER — Ambulatory Visit (INDEPENDENT_AMBULATORY_CARE_PROVIDER_SITE_OTHER): Payer: 59 | Admitting: Obstetrics & Gynecology

## 2018-12-31 VITALS — BP 164/112 | HR 115 | Ht 65.0 in | Wt 261.0 lb

## 2018-12-31 DIAGNOSIS — Z01419 Encounter for gynecological examination (general) (routine) without abnormal findings: Secondary | ICD-10-CM | POA: Diagnosis present

## 2018-12-31 NOTE — Addendum Note (Signed)
Addended by: Diona Fanti A on: 12/31/2018 04:26 PM   Modules accepted: Orders

## 2018-12-31 NOTE — Progress Notes (Signed)
Subjective:     Candace Maxwell is a 28 y.o. female here for a routine exam.  Patient's last menstrual period was 11/12/2018. No obstetric history on file. Birth Control Method:  none Menstrual Calendar(currently): not regular  Current complaints: none.   Current acute medical issues:  none   Recent Gynecologic History Patient's last menstrual period was 11/12/2018. Last Pap: 2018,  normal Last mammogram: ,    No past medical history on file.  Past Surgical History:  Procedure Laterality Date  . CHOLECYSTECTOMY  1 1 2012    OB History   No obstetric history on file.     Social History   Socioeconomic History  . Marital status: Single    Spouse name: Not on file  . Number of children: Not on file  . Years of education: Not on file  . Highest education level: Not on file  Occupational History  . Not on file  Social Needs  . Financial resource strain: Not on file  . Food insecurity:    Worry: Not on file    Inability: Not on file  . Transportation needs:    Medical: Not on file    Non-medical: Not on file  Tobacco Use  . Smoking status: Never Smoker  . Smokeless tobacco: Never Used  Substance and Sexual Activity  . Alcohol use: Never    Frequency: Never  . Drug use: Never  . Sexual activity: Not Currently  Lifestyle  . Physical activity:    Days per week: Not on file    Minutes per session: Not on file  . Stress: Not on file  Relationships  . Social connections:    Talks on phone: Not on file    Gets together: Not on file    Attends religious service: Not on file    Active member of club or organization: Not on file    Attends meetings of clubs or organizations: Not on file    Relationship status: Not on file  Other Topics Concern  . Not on file  Social History Narrative  . Not on file    No family history on file.   Current Outpatient Medications:  .  amoxicillin (AMOXIL) 500 MG capsule, Take 1 capsule (500 mg total) by mouth 3 (three) times  daily., Disp: 30 capsule, Rfl: 0 .  EPIDUO 0.1-2.5 % gel, APPLY AT BEDTIME., Disp: 45 g, Rfl: 4 .  ISIBLOOM 0.15-30 MG-MCG tablet, TAKE 1 TABLET BY MOUTH EVERY DAY, Disp: 28 tablet, Rfl: 11 .  albuterol (PROVENTIL HFA;VENTOLIN HFA) 108 (90 Base) MCG/ACT inhaler, Inhale 2 puffs into the lungs every 6 (six) hours as needed for wheezing or shortness of breath. (Patient not taking: Reported on 12/03/2018), Disp: 1 Inhaler, Rfl: 0 .  azithromycin (ZITHROMAX Z-PAK) 250 MG tablet, Take 2 tablets (500 mg) on  Day 1,  followed by 1 tablet (250 mg) once daily on Days 2 through 5. (Patient not taking: Reported on 12/31/2018), Disp: 6 each, Rfl: 0 .  fluconazole (DIFLUCAN) 150 MG tablet, TAKE 1 TABLET BY MOUTH ONCE DAILY AS NEEDED FOR  YEAST  INFECTION.  MAY  REPEAT IN 3 DAYS (Patient not taking: Reported on 12/31/2018), Disp: 2 tablet, Rfl: 0 .  ketoconazole (NIZORAL) 2 % cream, APPLY  CREAM TOPICALLY TO AFFECTED AREA TWICE DAILY (Patient not taking: Reported on 12/31/2018), Disp: 30 g, Rfl: 2  Review of Systems  Review of Systems  Constitutional: Negative for fever, chills, weight loss, malaise/fatigue and diaphoresis.  HENT: Negative for hearing loss, ear pain, nosebleeds, congestion, sore throat, neck pain, tinnitus and ear discharge.   Eyes: Negative for blurred vision, double vision, photophobia, pain, discharge and redness.  Respiratory: Negative for cough, hemoptysis, sputum production, shortness of breath, wheezing and stridor.   Cardiovascular: Negative for chest pain, palpitations, orthopnea, claudication, leg swelling and PND.  Gastrointestinal: negative for abdominal pain. Negative for heartburn, nausea, vomiting, diarrhea, constipation, blood in stool and melena.  Genitourinary: Negative for dysuria, urgency, frequency, hematuria and flank pain.  Musculoskeletal: Negative for myalgias, back pain, joint pain and falls.  Skin: Negative for itching and rash.  Neurological: Negative for dizziness,  tingling, tremors, sensory change, speech change, focal weakness, seizures, loss of consciousness, weakness and headaches.  Endo/Heme/Allergies: Negative for environmental allergies and polydipsia. Does not bruise/bleed easily.  Psychiatric/Behavioral: Negative for depression, suicidal ideas, hallucinations, memory loss and substance abuse. The patient is not nervous/anxious and does not have insomnia.        Objective:  Blood pressure (!) 164/112, pulse (!) 115, height 5\' 5"  (1.651 m), weight 261 lb (118.4 kg), last menstrual period 11/12/2018.   Physical Exam  Vitals reviewed. Constitutional: She is oriented to person, place, and time. She appears well-developed and well-nourished.  HENT:  Head: Normocephalic and atraumatic.        Right Ear: External ear normal.  Left Ear: External ear normal.  Nose: Nose normal.  Mouth/Throat: Oropharynx is clear and moist.  Eyes: Conjunctivae and EOM are normal. Pupils are equal, round, and reactive to light. Right eye exhibits no discharge. Left eye exhibits no discharge. No scleral icterus.  Neck: Normal range of motion. Neck supple. No tracheal deviation present. No thyromegaly present.  Cardiovascular: Normal rate, regular rhythm, normal heart sounds and intact distal pulses.  Exam reveals no gallop and no friction rub.   No murmur heard. Respiratory: Effort normal and breath sounds normal. No respiratory distress. She has no wheezes. She has no rales. She exhibits no tenderness.  GI: Soft. Bowel sounds are normal. She exhibits no distension and no mass. There is no tenderness. There is no rebound and no guarding.  Genitourinary:  Breasts no masses skin changes or nipple changes bilaterally      Vulva is normal without lesions Vagina is pink moist without discharge Cervix normal in appearance and pap is done Uterus is normal size shape and contour Adnexa is negative with normal sized ovaries   Musculoskeletal: Normal range of motion. She  exhibits no edema and no tenderness.  Neurological: She is alert and oriented to person, place, and time. She has normal reflexes. She displays normal reflexes. No cranial nerve deficit. She exhibits normal muscle tone. Coordination normal.  Skin: Skin is warm and dry. No rash noted. No erythema. No pallor.  Psychiatric: She has a normal mood and affect. Her behavior is normal. Judgment and thought content normal.       Medications Ordered at today's visit: No orders of the defined types were placed in this encounter.   Other orders placed at today's visit: No orders of the defined types were placed in this encounter.     Assessment:    Healthy female exam.    Plan:    Contraception: none. Follow up in: 2 years.     No follow-ups on file.

## 2019-01-03 LAB — CYTOLOGY - PAP
Adequacy: ABSENT
Chlamydia: NEGATIVE
Diagnosis: NEGATIVE
Neisseria Gonorrhea: NEGATIVE

## 2019-02-04 ENCOUNTER — Other Ambulatory Visit: Payer: Self-pay

## 2019-02-04 ENCOUNTER — Encounter: Payer: Self-pay | Admitting: Family Medicine

## 2019-02-04 ENCOUNTER — Ambulatory Visit (INDEPENDENT_AMBULATORY_CARE_PROVIDER_SITE_OTHER): Payer: 59 | Admitting: Family Medicine

## 2019-02-04 DIAGNOSIS — J358 Other chronic diseases of tonsils and adenoids: Secondary | ICD-10-CM

## 2019-02-04 MED ORDER — CEFDINIR 300 MG PO CAPS: ORAL_CAPSULE | ORAL | 0 refills | Status: DC

## 2019-02-04 NOTE — Progress Notes (Signed)
   Subjective:  Audio  Patient ID: Candace Maxwell, female    DOB: 1990/10/01, 28 y.o.   MRN: [id number]  Sore Throat  This is a recurrent problem. Associated symptoms comments: Loss of taste in back part of throat. Pt has had reoccurring tonsil stones . Treatments tried: antibiotics and salt water gargle. The treatment provided mild relief.  pt states the antibiotics do help but when the stones come back they switch sides.  Virtual Visit via Video Note  I connected with Candace Maxwell on 02/04/19 at  4:10 PM EDT by a video enabled telemedicine application and verified that I am speaking with the correct person using two identifiers.  Location: Patient: home Provider: office   I discussed the limitations of evaluation and management by telemedicine and the availability of in person appointments. The patient expressed understanding and agreed to proceed.  History of Present Illness:    Observations/Objective:   Assessment and Plan:   Follow Up Instructions:    I discussed the assessment and treatment plan with the patient. The patient was provided an opportunity to ask questions and all were answered. The patient agreed with the plan and demonstrated an understanding of the instructions.   The patient was advised to call back or seek an in-person evaluation if the symptoms worsen or if the condition fails to improve as anticipated.  I provided 15 minutes of non-face-to-face time during this encounter.   Vicente Males, LPN  No cough congestion drainage or stuffiness.  No chest discomfort.  No sore throat.  Patient somewhat frustrated by recurrent nature of this.  Every month or 2 has tonsillitis with stones.  Painful.  Often has to use antibiotics   Review of Systems No headache, no major weight loss or weight gain, no chest pain no back pain abdominal pain no change in bowel habits complete ROS otherwise negative     Objective:   Physical Exam  Virtual       Assessment & Plan:  Recurrent tonsillitis with acute flare.  Management discussed.  Add antibiotics.  ENT referral.  Rationale discussed

## 2019-02-05 ENCOUNTER — Encounter: Payer: Self-pay | Admitting: Family Medicine

## 2019-02-15 ENCOUNTER — Encounter: Payer: Self-pay | Admitting: Family Medicine

## 2019-02-15 ENCOUNTER — Telehealth: Payer: Self-pay | Admitting: Family Medicine

## 2019-02-15 ENCOUNTER — Ambulatory Visit
Admission: EM | Admit: 2019-02-15 | Discharge: 2019-02-15 | Disposition: A | Discharge status: Home / Self Care | Payer: 59 | Attending: Physician Assistant | Admitting: Physician Assistant

## 2019-02-15 ENCOUNTER — Other Ambulatory Visit: Payer: Self-pay | Admitting: Family Medicine

## 2019-02-15 ENCOUNTER — Other Ambulatory Visit: Payer: Self-pay

## 2019-02-15 DIAGNOSIS — T368X5A Adverse effect of other systemic antibiotics, initial encounter: Secondary | ICD-10-CM | POA: Diagnosis not present

## 2019-02-15 DIAGNOSIS — L500 Allergic urticaria: Secondary | ICD-10-CM | POA: Diagnosis not present

## 2019-02-15 DIAGNOSIS — T50905A Adverse effect of unspecified drugs, medicaments and biological substances, initial encounter: Secondary | ICD-10-CM

## 2019-02-15 DIAGNOSIS — L509 Urticaria, unspecified: Secondary | ICD-10-CM

## 2019-02-15 MED ORDER — PREDNISONE 20 MG PO TABS: ORAL_TABLET | ORAL | 0 refills | Status: DC

## 2019-02-15 MED ORDER — METHYLPREDNISOLONE SODIUM SUCC 125 MG IJ SOLR
125.0000 mg | Freq: Once | INTRAMUSCULAR | Status: AC
  Administered 2019-02-15: 125 mg via INTRAMUSCULAR

## 2019-02-15 NOTE — Discharge Instructions (Signed)
Stop antibiotic.   Benadryl 25 mg every 4 hours.  Return if any problems.

## 2019-02-15 NOTE — Telephone Encounter (Signed)
Pt contacted office; pt has sent MyChart message earlier. Pt states she is having hives

## 2019-02-15 NOTE — Telephone Encounter (Signed)
Medication marked as allergy.

## 2019-02-15 NOTE — ED Provider Notes (Signed)
[facility]    CSN: [id number] Arrival date & time: 02/15/19  1327      History   Chief Complaint Chief Complaint  Patient presents with  . Urticaria    HPI Candace Maxwell is a 28 y.o. female.   The history is provided by the patient. No language interpreter was used.  Urticaria This is a new problem. The current episode started 1 to 2 hours ago. The problem occurs constantly. The problem has not changed since onset.Nothing aggravates the symptoms. Nothing relieves the symptoms. She has tried nothing for the symptoms. The treatment provided no relief.  Pt complains of rash. Pt has been on cefdinir for tonsillitis.  Pt reports throat is better.  She is scheduled to see Dr. Suszanne Connersteoh for evaltuion.   History reviewed. No pertinent past medical history.  Patient Active Problem List   Diagnosis Date Noted  . Allergic rhinitis 11/17/2013  . Acne 09/09/2013    Past Surgical History:  Procedure Laterality Date  . CHOLECYSTECTOMY  1 1 2012    OB History   No obstetric history on file.      Home Medications    Prior to Admission medications   Medication Sig Start Date End Date Taking? Authorizing Provider  albuterol (PROVENTIL HFA;VENTOLIN HFA) 108 (90 Base) MCG/ACT inhaler Inhale 2 puffs into the lungs every 6 (six) hours as needed for wheezing or shortness of breath. Patient not taking: Reported on 12/03/2018 11/10/15   Merlyn AlbertLuking, William S, MD  amoxicillin (AMOXIL) 500 MG capsule Take 1 capsule (500 mg total) by mouth 3 (three) times daily. Patient not taking: Reported on 02/04/2019 12/24/18   Babs SciaraLuking, Scott A, MD  azithromycin (ZITHROMAX Z-PAK) 250 MG tablet Take 2 tablets (500 mg) on  Day 1,  followed by 1 tablet (250 mg) once daily on Days 2 through 5. Patient not taking: Reported on 12/31/2018 12/03/18   Babs SciaraLuking, Scott A, MD  cefdinir (OMNICEF) 300 MG capsule Take one capsule by mouth BID for 10 days 02/04/19   Babs SciaraLuking, Scott A, MD  EPIDUO 0.1-2.5 % gel APPLY AT BEDTIME.  11/27/14   Babs SciaraLuking, Scott A, MD  fluconazole (DIFLUCAN) 150 MG tablet TAKE 1 TABLET BY MOUTH ONCE DAILY AS NEEDED FOR  YEAST  INFECTION.  MAY  REPEAT IN 3 DAYS Patient not taking: Reported on 12/31/2018 12/03/18   Babs SciaraLuking, Scott A, MD  ISIBLOOM 0.15-30 MG-MCG tablet TAKE 1 TABLET BY MOUTH EVERY DAY 10/12/18   Lazaro ArmsEure, Luther H, MD  ketoconazole (NIZORAL) 2 % cream APPLY  CREAM TOPICALLY TO AFFECTED AREA TWICE DAILY Patient not taking: Reported on 12/31/2018 03/16/17   Babs SciaraLuking, Scott A, MD    Family History Family History  Problem Relation Age of Onset  . Healthy Father   . Healthy Mother     Social History Social History   Tobacco Use  . Smoking status: Never Smoker  . Smokeless tobacco: Never Used  Substance Use Topics  . Alcohol use: Never    Frequency: Never  . Drug use: Never     Allergies   Patient has no known allergies.   Review of Systems Review of Systems  All other systems reviewed and are negative.    Physical Exam Triage Vital Signs ED Triage Vitals [02/15/19 1336]  Enc Vitals Group     BP (!) 167/97     Pulse Rate (!) 110     Resp 18     Temp 98.3 F (36.8 C)  Temp src      SpO2 98 %     Weight      Height      Head Circumference      Peak Flow      Pain Score 0     Pain Loc      Pain Edu?      Excl. in [place]?    No data found.  Updated Vital Signs BP (!) 167/97   Pulse (!) 110   Temp 98.3 F (36.8 C)   Resp 18   LMP 01/25/2019   SpO2 98%   Visual Acuity Right Eye Distance:   Left Eye Distance:   Bilateral Distance:    Right Eye Near:   Left Eye Near:    Bilateral Near:     Physical Exam Vitals signs and nursing note reviewed.  Constitutional:      Appearance: She is well-developed.  HENT:     Head: Normocephalic.     Nose: Nose normal.     Mouth/Throat:     Mouth: Mucous membranes are moist.  Eyes:     Pupils: Pupils are equal, round, and reactive to light.  Neck:     Musculoskeletal: Normal range of motion.  Cardiovascular:      Rate and Rhythm: Normal rate.  Pulmonary:     Effort: Pulmonary effort is normal.  Abdominal:     General: There is no distension.  Musculoskeletal: Normal range of motion.  Skin:    Comments: hives upper arms and chest  Neurological:     Mental Status: She is alert and oriented to person, place, and time.  Psychiatric:        Mood and Affect: Mood normal.      UC Treatments / Results  Labs (all labs ordered are listed, but only abnormal results are displayed) Labs Reviewed - No data to display  EKG   Radiology No results found.  Procedures Procedures (including critical care time)  Medications Ordered in UC Medications  methylPREDNISolone sodium succinate (SOLU-MEDROL) 125 mg/2 mL injection 125 mg (125 mg Intramuscular Given 02/15/19 1346)    Initial Impression / Assessment and Plan / UC Course  I have reviewed the triage vital signs and the nursing notes.  Pertinent labs & imaging results that were available during my care of the patient were reviewed by me and considered in my medical decision making (see chart for details).     MDM  Pt's tonsil are enlarged but not infammed.  I advised her to stop omnicef.  Pt advised benadryl every 4 hours.  Pt given solumedrol here.  Final Clinical Impressions(s) / UC Diagnoses   Final diagnoses:  Hives  Adverse effect of drug, initial encounter     Discharge Instructions     Stop antibiotic.   Benadryl 25 mg every 4 hours.  Return if any problems.     ED Prescriptions    None     Controlled Substance Prescriptions  Controlled Substance Registry consulted? Not Applicable   Fransico Meadow, [place] 02/15/19 1410

## 2019-02-15 NOTE — Telephone Encounter (Signed)
Patient had allergic reaction related to [place].  Please mark this as a allergy-she had hives I recommend to the patient she stay away from this medicine Once the medication has been noted as allergy it is not necessary to call the patient I already spoke with her and advised her to stop taking the medication and to use prednisone and Benadryl-warning signs were discussed

## 2019-02-15 NOTE — Telephone Encounter (Signed)
This patient had an allergic reaction to her antibiotic.  She went to the urgent care.  They stop the antibiotic.  They gave her a shot of steroids.  They are recommending Benadryl.  She is still having itching and hives.  Therefore we will use a short course of prednisone taper along with Benadryl certainly patient is aware if she gets dramatically worse to go back to the ER otherwise to give [place] follow-up early next week caution drowsiness with Benadryl

## 2019-02-15 NOTE — Telephone Encounter (Signed)
Pt contacted office. Pt had sent MyChart message earlier regarding a possible allergic reaction. Pt was put on Cefdinir 300 mg tablets for tonsil stone on 02/04/2019.Pt states she is now having hives all over and she is itching bad. Pt is not having any trouble breathing. Pt advised to go to Urgent Care. Pt verbalized understanding.

## 2019-02-15 NOTE — ED Triage Notes (Signed)
Pt presents with complaints of hives that started today. She has been on omnicef x 7 days. Hives are currently present in the patients under arm area.

## 2019-03-07 ENCOUNTER — Ambulatory Visit (INDEPENDENT_AMBULATORY_CARE_PROVIDER_SITE_OTHER): Payer: 59 | Admitting: Otolaryngology

## 2019-03-07 ENCOUNTER — Other Ambulatory Visit: Payer: Self-pay

## 2019-03-07 DIAGNOSIS — J351 Hypertrophy of tonsils: Secondary | ICD-10-CM

## 2019-03-07 DIAGNOSIS — H6121 Impacted cerumen, right ear: Secondary | ICD-10-CM

## 2019-03-07 DIAGNOSIS — J3501 Chronic tonsillitis: Secondary | ICD-10-CM

## 2019-03-18 ENCOUNTER — Other Ambulatory Visit: Payer: Self-pay | Admitting: Otolaryngology

## 2019-03-28 ENCOUNTER — Encounter: Payer: Self-pay | Admitting: Family Medicine

## 2019-03-28 MED ORDER — AZITHROMYCIN 250 MG PO TABS: ORAL_TABLET | ORAL | 0 refills | Status: DC

## 2019-03-28 NOTE — Telephone Encounter (Signed)
We can do virtual visit on Fri or with dr Richardson Landry today if he has opening

## 2019-03-28 NOTE — Addendum Note (Signed)
Addended by: Vicente Males on: 03/28/2019 02:54 PM   Modules accepted: Orders

## 2019-04-15 ENCOUNTER — Encounter (HOSPITAL_BASED_OUTPATIENT_CLINIC_OR_DEPARTMENT_OTHER): Payer: Self-pay | Admitting: *Deleted

## 2019-04-15 ENCOUNTER — Other Ambulatory Visit: Payer: Self-pay

## 2019-04-18 ENCOUNTER — Other Ambulatory Visit (HOSPITAL_COMMUNITY)
Admission: RE | Admit: 2019-04-18 | Discharge: 2019-04-18 | Disposition: A | Discharge status: Home / Self Care | Payer: 59 | Source: Ambulatory Visit | Attending: Otolaryngology | Admitting: Otolaryngology

## 2019-04-18 DIAGNOSIS — Z01812 Encounter for preprocedural laboratory examination: Secondary | ICD-10-CM | POA: Insufficient documentation

## 2019-04-18 DIAGNOSIS — Z20828 Contact with and (suspected) exposure to other viral communicable diseases: Secondary | ICD-10-CM | POA: Insufficient documentation

## 2019-04-19 LAB — NOVEL CORONAVIRUS, NAA (HOSP ORDER, SEND-OUT TO REF LAB; TAT 18-24 HRS): SARS-CoV-2, NAA: NOT DETECTED

## 2019-04-22 ENCOUNTER — Ambulatory Visit (HOSPITAL_BASED_OUTPATIENT_CLINIC_OR_DEPARTMENT_OTHER)
Admission: RE | Admit: 2019-04-22 | Discharge: 2019-04-22 | Disposition: A | Discharge status: Home / Self Care | Payer: 59 | Attending: Otolaryngology | Admitting: Otolaryngology

## 2019-04-22 ENCOUNTER — Encounter (HOSPITAL_BASED_OUTPATIENT_CLINIC_OR_DEPARTMENT_OTHER): Payer: Self-pay | Admitting: Anesthesiology

## 2019-04-22 ENCOUNTER — Other Ambulatory Visit: Payer: Self-pay

## 2019-04-22 ENCOUNTER — Ambulatory Visit (HOSPITAL_BASED_OUTPATIENT_CLINIC_OR_DEPARTMENT_OTHER): Payer: 59 | Admitting: Anesthesiology

## 2019-04-22 ENCOUNTER — Encounter (HOSPITAL_BASED_OUTPATIENT_CLINIC_OR_DEPARTMENT_OTHER)
Admission: RE | Disposition: A | Discharge status: Home / Self Care | Payer: Self-pay | Source: Home / Self Care | Attending: Otolaryngology

## 2019-04-22 DIAGNOSIS — J353 Hypertrophy of tonsils with hypertrophy of adenoids: Secondary | ICD-10-CM | POA: Diagnosis not present

## 2019-04-22 DIAGNOSIS — J3501 Chronic tonsillitis: Secondary | ICD-10-CM | POA: Diagnosis not present

## 2019-04-22 HISTORY — PX: TONSILLECTOMY AND ADENOIDECTOMY: SHX28

## 2019-04-22 LAB — POCT PREGNANCY, URINE: Preg Test, Ur: NEGATIVE

## 2019-04-22 SURGERY — TONSILLECTOMY AND ADENOIDECTOMY
Anesthesia: General | Site: Throat | Laterality: Bilateral

## 2019-04-22 MED ORDER — ESMOLOL HCL 100 MG/10ML IV SOLN
INTRAVENOUS | Status: DC | PRN
  Administered 2019-04-22: 30 mg via INTRAVENOUS
  Administered 2019-04-22: 20 mg via INTRAVENOUS

## 2019-04-22 MED ORDER — OXYMETAZOLINE HCL 0.05 % NA SOLN
NASAL | Status: AC
Start: 2019-04-22 — End: ?
  Filled 2019-04-22: qty 30

## 2019-04-22 MED ORDER — OXYCODONE-ACETAMINOPHEN 5-325 MG PO TABS: 1.0000 | ORAL_TABLET | ORAL | 0 refills | Status: AC | PRN

## 2019-04-22 MED ORDER — MIDAZOLAM HCL 2 MG/2ML IJ SOLN
INTRAMUSCULAR | Status: AC
  Filled 2019-04-22: qty 2

## 2019-04-22 MED ORDER — DEXAMETHASONE SODIUM PHOSPHATE 4 MG/ML IJ SOLN
INTRAMUSCULAR | Status: DC | PRN
  Administered 2019-04-22: 10 mg via INTRAVENOUS

## 2019-04-22 MED ORDER — SUCCINYLCHOLINE CHLORIDE 20 MG/ML IJ SOLN
INTRAMUSCULAR | Status: DC | PRN
  Administered 2019-04-22: 120 mg via INTRAVENOUS

## 2019-04-22 MED ORDER — OXYMETAZOLINE HCL 0.05 % NA SOLN
NASAL | Status: DC | PRN
  Administered 2019-04-22: 1 via TOPICAL

## 2019-04-22 MED ORDER — PROPOFOL 10 MG/ML IV BOLUS
INTRAVENOUS | Status: AC
  Filled 2019-04-22: qty 20

## 2019-04-22 MED ORDER — HYDROMORPHONE HCL 1 MG/ML IJ SOLN
INTRAMUSCULAR | Status: AC
  Filled 2019-04-22: qty 0.5

## 2019-04-22 MED ORDER — HYDROMORPHONE HCL 1 MG/ML IJ SOLN
0.2500 mg | INTRAMUSCULAR | Status: DC | PRN
  Administered 2019-04-22 (×2): 0.5 mg via INTRAVENOUS

## 2019-04-22 MED ORDER — MIDAZOLAM HCL 2 MG/2ML IJ SOLN: 1.0000 mg | INTRAMUSCULAR | Status: DC | PRN

## 2019-04-22 MED ORDER — FENTANYL CITRATE (PF) 100 MCG/2ML IJ SOLN
INTRAMUSCULAR | Status: AC
  Filled 2019-04-22: qty 2

## 2019-04-22 MED ORDER — FENTANYL CITRATE (PF) 100 MCG/2ML IJ SOLN: 50.0000 ug | INTRAMUSCULAR | Status: DC | PRN

## 2019-04-22 MED ORDER — MIDAZOLAM HCL 5 MG/5ML IJ SOLN
INTRAMUSCULAR | Status: DC | PRN
  Administered 2019-04-22: 2 mg via INTRAVENOUS

## 2019-04-22 MED ORDER — LACTATED RINGERS IV SOLN
INTRAVENOUS | Status: DC
  Administered 2019-04-22: 09:00:00 via INTRAVENOUS

## 2019-04-22 MED ORDER — FENTANYL CITRATE (PF) 100 MCG/2ML IJ SOLN
INTRAMUSCULAR | Status: DC | PRN
  Administered 2019-04-22 (×2): 50 ug via INTRAVENOUS

## 2019-04-22 MED ORDER — OXYCODONE HCL 5 MG/5ML PO SOLN
5.0000 mg | Freq: Once | ORAL | Status: AC | PRN
  Administered 2019-04-22: 5 mg via ORAL

## 2019-04-22 MED ORDER — SUGAMMADEX SODIUM 500 MG/5ML IV SOLN
INTRAVENOUS | Status: DC | PRN
  Administered 2019-04-22: 400 mg via INTRAVENOUS

## 2019-04-22 MED ORDER — ONDANSETRON HCL 4 MG/2ML IJ SOLN
INTRAMUSCULAR | Status: DC | PRN
  Administered 2019-04-22: 4 mg via INTRAVENOUS

## 2019-04-22 MED ORDER — PROPOFOL 10 MG/ML IV BOLUS
INTRAVENOUS | Status: DC | PRN
  Administered 2019-04-22: 50 mg via INTRAVENOUS
  Administered 2019-04-22: 200 mg via INTRAVENOUS

## 2019-04-22 MED ORDER — FLUCONAZOLE 150 MG PO TABS: 150.0000 mg | ORAL_TABLET | Freq: Once | ORAL | 0 refills | Status: AC

## 2019-04-22 MED ORDER — MEPERIDINE HCL 25 MG/ML IJ SOLN: 6.2500 mg | INTRAMUSCULAR | Status: DC | PRN

## 2019-04-22 MED ORDER — BACITRACIN ZINC 500 UNIT/GM EX OINT
TOPICAL_OINTMENT | CUTANEOUS | Status: AC
  Filled 2019-04-22: qty 0.9

## 2019-04-22 MED ORDER — ROCURONIUM BROMIDE 10 MG/ML (PF) SYRINGE
PREFILLED_SYRINGE | INTRAVENOUS | Status: DC | PRN
  Administered 2019-04-22: 40 mg via INTRAVENOUS

## 2019-04-22 MED ORDER — OXYCODONE HCL 5 MG/5ML PO SOLN
ORAL | Status: AC
  Filled 2019-04-22: qty 5

## 2019-04-22 MED ORDER — LIDOCAINE 2% (20 MG/ML) 5 ML SYRINGE
INTRAMUSCULAR | Status: DC | PRN
  Administered 2019-04-22: 60 mg via INTRAVENOUS

## 2019-04-22 MED ORDER — AMOXICILLIN 400 MG/5ML PO SUSR: 800.0000 mg | Freq: Two times a day (BID) | ORAL | 0 refills | Status: AC

## 2019-04-22 MED ORDER — PROMETHAZINE HCL 25 MG/ML IJ SOLN: 6.2500 mg | INTRAMUSCULAR | Status: DC | PRN

## 2019-04-22 MED ORDER — SODIUM CHLORIDE 0.9 % IR SOLN
Status: DC | PRN
  Administered 2019-04-22: 1

## 2019-04-22 MED ORDER — OXYCODONE HCL 5 MG PO TABS: 5.0000 mg | ORAL_TABLET | Freq: Once | ORAL | Status: AC | PRN

## 2019-04-22 SURGICAL SUPPLY — 33 items
BNDG COHESIVE 2X5 TAN STRL LF (GAUZE/BANDAGES/DRESSINGS) IMPLANT
CANISTER SUCT 1200ML W/VALVE (MISCELLANEOUS) ×2 IMPLANT
CATH ROBINSON RED A/P 10FR (CATHETERS) IMPLANT
CATH ROBINSON RED A/P 14FR (CATHETERS) IMPLANT
COAGULATOR SUCT SWTCH 10FR 6 (ELECTROSURGICAL) IMPLANT
COVER BACK TABLE REUSABLE LG (DRAPES) ×2 IMPLANT
COVER MAYO STAND REUSABLE (DRAPES) ×2 IMPLANT
COVER WAND RF STERILE (DRAPES) IMPLANT
DRAPE HALF SHEET 70X43 (DRAPES) ×2 IMPLANT
ELECT REM PT RETURN 9FT ADLT (ELECTROSURGICAL)
ELECT REM PT RETURN 9FT PED (ELECTROSURGICAL)
ELECTRODE REM PT RETRN 9FT PED (ELECTROSURGICAL) IMPLANT
ELECTRODE REM PT RTRN 9FT ADLT (ELECTROSURGICAL) IMPLANT
GAUZE SPONGE 4X4 12PLY STRL LF (GAUZE/BANDAGES/DRESSINGS) ×2 IMPLANT
GLOVE BIO SURGEON STRL SZ 6.5 (GLOVE) ×1 IMPLANT
GLOVE BIO SURGEON STRL SZ7 (GLOVE) ×1 IMPLANT
GLOVE BIO SURGEON STRL SZ7.5 (GLOVE) ×2 IMPLANT
GLOVE BIOGEL PI IND STRL 6.5 (GLOVE) IMPLANT
GLOVE BIOGEL PI INDICATOR 6.5 (GLOVE) ×1
GOWN STRL REUS W/ TWL LRG LVL3 (GOWN DISPOSABLE) ×2 IMPLANT
GOWN STRL REUS W/TWL LRG LVL3 (GOWN DISPOSABLE) ×6
IV NS 500ML (IV SOLUTION) ×2
IV NS 500ML BAXH (IV SOLUTION) ×1 IMPLANT
MARKER SKIN DUAL TIP RULER LAB (MISCELLANEOUS) IMPLANT
NS IRRIG 1000ML POUR BTL (IV SOLUTION) ×2 IMPLANT
SOLUTION BUTLER CLEAR DIP (MISCELLANEOUS) ×2 IMPLANT
SPONGE TONSIL TAPE 1.25 RFD (DISPOSABLE) ×2 IMPLANT
SYR BULB 3OZ (MISCELLANEOUS) IMPLANT
TOWEL GREEN STERILE FF (TOWEL DISPOSABLE) ×2 IMPLANT
TUBE CONNECTING 20X1/4 (TUBING) ×2 IMPLANT
TUBE SALEM SUMP 12R W/ARV (TUBING) IMPLANT
TUBE SALEM SUMP 16 FR W/ARV (TUBING) IMPLANT
WAND COBLATOR 70 EVAC XTRA (SURGICAL WAND) ×2 IMPLANT

## 2019-04-22 NOTE — Discharge Instructions (Addendum)
°Post Anesthesia Home Care Instructions ° °Activity: °Get plenty of rest for the remainder of the day. A responsible individual must stay with you for 24 hours following the procedure.  °For the next 24 hours, DO NOT: °-Drive a car °-Operate machinery °-Drink alcoholic beverages °-Take any medication unless instructed by your physician °-Make any legal decisions or sign important papers. ° °Meals: °Start with liquid foods such as gelatin or soup. Progress to regular foods as tolerated. Avoid greasy, spicy, heavy foods. If nausea and/or vomiting occur, drink only clear liquids until the nausea and/or vomiting subsides. Call your physician if vomiting continues. ° °Special Instructions/Symptoms: °Your throat may feel dry or sore from the anesthesia or the breathing tube placed in your throat during surgery. If this causes discomfort, gargle with warm salt water. The discomfort should disappear within 24 hours. ° °If you had a scopolamine patch placed behind your ear for the management of post- operative nausea and/or vomiting: ° °1. The medication in the patch is effective for 72 hours, after which it should be removed.  Wrap patch in a tissue and discard in the trash. Wash hands thoroughly with soap and water. °2. You may remove the patch earlier than 72 hours if you experience unpleasant side effects which may include dry mouth, dizziness or visual disturbances. °3. Avoid touching the patch. Wash your hands with soap and water after contact with the patch. °  °SU WOOI TEOH M.D., P.A. °Postoperative Instructions for Tonsillectomy & Adenoidectomy (T&A) °Activity °Restrict activity at home for the first two days, resting as much as possible. Light indoor activity is best. You may usually return to school or work within a week but void strenuous activity and sports for two weeks. Sleep with your head elevated on 2-3 pillows for 3-4 days to help decrease swelling. °Diet °Due to tissue swelling and throat discomfort, you  may have little desire to drink for several days. However fluids are very important to prevent dehydration. You will find that non-acidic juices, soups, popsicles, Jell-O, custard, puddings, and any soft or mashed foods taken in small quantities can be swallowed fairly easily. Try to increase your fluid and food intake as the discomfort subsides. It is recommended that a child receive 1-1/2 quarts of fluid in a 24-hour period. Adult require twice this amount.  °Discomfort °Your sore throat may be relieved by applying an ice collar to your neck and/or by taking Tylenol®. You may experience an earache, which is due to referred pain from the throat. Referred ear pain is commonly felt at night when trying to rest. ° °Bleeding                        Although rare, there is risk of having some bleeding during the first 2 weeks after having a T&A. This usually happens between days 7-10 postoperatively. If you or your child should have any bleeding, try to remain calm. We recommend sitting up quietly in a chair and gently spitting out the blood into a bowl. For adults, gargling gently with ice water may help. If the bleeding does not stop after a short time (5 minutes), is more than 1 teaspoonful, or if you become worried, please call our office at [phone] or go directly to the nearest hospital emergency room. Do not eat or drink anything prior to going to the hospital as you may need to be taken to the operating room in order to control the bleeding. °GENERAL CONSIDERATIONS °  1. Brush your teeth regularly. Avoid mouthwashes and gargles for three weeks. You may gargle gently with warm salt-water as necessary or spray with Chloraseptic®. You may make salt-water by placing 2 teaspoons of table salt into a quart of fresh water. Warm the salt-water in a microwave to a luke warm temperature.  °2. Avoid exposure to colds and upper respiratory infections if possible.  °3. If you look into a mirror or into your child's mouth,  you will see white-gray patches in the back of the throat. This is normal after having a T&A and is like a scab that forms on the skin after an abrasion. It will disappear once the back of the throat heals completely. However, it may cause a noticeable odor; this too will disappear with time. Again, warm salt-water gargles may be used to help keep the throat clean and promote healing.  °4. You may notice a temporary change in voice quality, such as a higher pitched voice or a nasal sound, until healing is complete. This may last for 1-2 weeks and should resolve.  °5. Do not take or give you child any medications that we have not prescribed or recommended.  °6. Snoring may occur, especially at night, for the first week after a T&A. It is due to swelling of the soft palate and will usually resolve.  °Please call our office at [phone] if you have any questions.   °

## 2019-04-22 NOTE — H&P (Signed)
Cc: Tonsil Stones, recurrent sore throat  HPI: The patient is a 28 y/o female who presents today for evaluation of recurrent sore throat and tonsil stones. The patient is seen in consultation requested by Dr. Sallee Lange. The patient has noted tonsil stones since she was a teenager but has recently noted increased frequency and recurrent sore throat. The patient has been treated with multiple antibiotics in the past few months with only short term relief of her throat pain. She continues to have multiple tonsilloliths with associated halitosis. The patient is otherwise healthy. No previous ENT surgery is noted.   The patient's review of systems (constitutional, eyes, ENT, cardiovascular, respiratory, GI, musculoskeletal, skin, neurologic, psychiatric, endocrine, hematologic, allergic) is noted in the ROS questionnaire.  It is reviewed with the patient.   Family health history: No HTN, DM, CAD, hearing loss or bleeding disorder.  Major events: Gallbladder removed.  Ongoing medical problems: Depression, anxiety disorder.  Social history: The patient is single. She denies the use of tobacco, alcohol or illegal drugs.   Exam General: Communicates without difficulty, well nourished, no acute distress. Head:  Normocephalic, no lesions or asymmetry. Eyes: PERRL, EOMI. No scleral icterus, conjunctivae clear.  Neuro: CN II exam reveals vision grossly intact.  No nystagmus at any point of gaze. EAC: Right cerumen impaction.  Under the operating microscope, the cerumen is carefully removed with a combination of cerumen currette, alligator forceps, and suction catheters.  After the cerumen is removed, the TMs are noted to be normal.  No mass, erythema, or lesions. Nose: Moist, pink mucosa without lesions or mass. Mouth: Oral cavity clear and moist, no lesions, tonsils symmetric. Tonsils are 3+, cryptic. Tonsils free of erythema and exudate. Neck: Full range of motion, no lymphadenopathy or masses.    Assessment 1. The patient's history and physical exam findings are consistent with chronic tonsillitis/pharyngitis and tonsilloliths with chronic halitosis secondary to adenotonsillar hypertrophy. 2. Right cerumen impaction.   Plan  1. Otomicroscopy with right cerumen removal. The patient is instructed not to use Q-tips to clean the ear canals.  2. The treatment options include continuing conservative observation versus adenotonsillectomy.  Based on the patient's history and physical exam findings, the patient will likely benefit from having the tonsils and adenoid removed.  The risks, benefits, alternatives, and details of the procedure are reviewed with the patient.  Questions are invited and answered.  3. The patient is interested in proceeding with the procedure.  We will schedule the procedure in accordance with the family schedule.

## 2019-04-22 NOTE — Transfer of Care (Signed)
Immediate Anesthesia Transfer of Care Note  Patient: Candace Maxwell  Procedure(s) Performed: TONSILLECTOMY AND ADENOIDECTOMY (Bilateral Throat)  Patient Location: PACU  Anesthesia Type:General  Level of Consciousness: awake  Airway & Oxygen Therapy: Patient Spontanous Breathing and Patient connected to face mask oxygen  Post-op Assessment: Report given to RN and Post -op Vital signs reviewed and stable  Post vital signs: Reviewed and stable  Last Vitals:  Vitals Value Taken Time  BP    Temp    Pulse 94 04/22/19 1018  Resp 12 04/22/19 1018  SpO2 100 % 04/22/19 1018  Vitals shown include unvalidated device data.  Last Pain:  Vitals:   04/22/19 0803  TempSrc: Oral  PainSc: 0-No pain         Complications: No apparent anesthesia complications

## 2019-04-22 NOTE — Anesthesia Postprocedure Evaluation (Signed)
Anesthesia Post Note  Patient: Candace Maxwell  Procedure(s) Performed: TONSILLECTOMY AND ADENOIDECTOMY (Bilateral Throat)     Patient location during evaluation: PACU Anesthesia Type: General Level of consciousness: sedated and patient cooperative Pain management: pain level controlled Vital Signs Assessment: post-procedure vital signs reviewed and stable Respiratory status: spontaneous breathing Cardiovascular status: stable Anesthetic complications: no    Last Vitals:  Vitals:   04/22/19 1045 04/22/19 1145  BP: (!) 128/91 134/80  Pulse: 90 91  Resp: 16 16  Temp:  36.8 C  SpO2: 98% 96%    Last Pain:  Vitals:   04/22/19 1145  TempSrc:   PainSc: 2                  Nolon Nations

## 2019-04-22 NOTE — Anesthesia Procedure Notes (Signed)
Procedure Name: Intubation Date/Time: 04/22/2019 9:46 AM Performed by: Lieutenant Diego, CRNA Pre-anesthesia Checklist: Patient identified, Emergency Drugs available, Suction available and Patient being monitored Patient Re-evaluated:Patient Re-evaluated prior to induction Oxygen Delivery Method: Circle system utilized Preoxygenation: Pre-oxygenation with 100% oxygen Induction Type: IV induction Ventilation: Mask ventilation without difficulty Laryngoscope Size: Miller and 2 Grade View: Grade I Tube type: Oral Tube size: 7.0 mm Number of attempts: 1 Airway Equipment and Method: Stylet Placement Confirmation: ETT inserted through vocal cords under direct vision,  positive ETCO2 and breath sounds checked- equal and bilateral Secured at: 23 cm Tube secured with: Tape Dental Injury: Teeth and Oropharynx as per pre-operative assessment

## 2019-04-22 NOTE — Op Note (Signed)
DATE OF PROCEDURE:  04/22/2019                              OPERATIVE REPORT  SURGEON:  Leta Baptist, MD  PREOPERATIVE DIAGNOSES: 1. Adenotonsillar hypertrophy. 2. Chronic tonsillitis and pharyngitis  POSTOPERATIVE DIAGNOSES: 1. Adenotonsillar hypertrophy. 2. Chronic tonsillitis and pharyngitis  PROCEDURE PERFORMED:  Adenotonsillectomy.  ANESTHESIA:  General endotracheal tube anesthesia.  COMPLICATIONS:  None.  ESTIMATED BLOOD LOSS:  Minimal.  INDICATION FOR PROCEDURE:  Candace Maxwell is a 28 y.o. female with a history of chronic tonsillitis/pharyngitis and halitosis.  According to the patient, she has been experiencing chronic throat discomfort with halitosis for several years. The patient continues to be symptomatic despite medical treatments. On examination, the patient was noted to have bilateral cryptic tonsils, with numerous tonsilloliths. Based on the above findings, the decision was made for the patient to undergo the adenotonsillectomy procedure. Likelihood of success in reducing symptoms was also discussed.  The risks, benefits, alternatives, and details of the procedure were discussed with the patient.  Questions were invited and answered.  Informed consent was obtained.  DESCRIPTION:  The patient was taken to the operating room and placed supine on the operating table.  General endotracheal tube anesthesia was administered by the anesthesiologist.  The patient was positioned and prepped and draped in a standard fashion for adenotonsillectomy.  A Crowe-Davis mouth gag was inserted into the oral cavity for exposure. 2+ cryptic tonsils were noted bilaterally.  No bifidity was noted.  Indirect mirror examination of the nasopharynx revealed mild adenoid hypertrophy. The adenoid was ablated with the Coblator device. Hemostasis was achieved with the Coblator device.  The right tonsil was then grasped with a straight Allis clamp and retracted medially.  It was resected free from the  underlying pharyngeal constrictor muscles with the Coblator device.  The same procedure was repeated on the left side without exception.  The surgical sites were copiously irrigated.  The mouth gag was removed.  The care of the patient was turned over to the anesthesiologist.  The patient was awakened from anesthesia without difficulty.  The patient was extubated and transferred to the recovery room in good condition.  OPERATIVE FINDINGS:  Adenotonsillar hypertrophy.  SPECIMEN:  None.  FOLLOWUP CARE:  The patient will be discharged home once awake and alert.  She will be placed on amoxicillin 800 mg p.o. b.i.d. for 5 days, and percocet for postop pain control.   The patient will follow up in my office in approximately 2 weeks.  Javanni Maring W Favour Aleshire 04/22/2019 10:16 AM

## 2019-04-22 NOTE — Anesthesia Preprocedure Evaluation (Signed)
Anesthesia Evaluation    Airway Mallampati: II  TM Distance: >3 FB Neck ROM: Full    Dental  (+) Dental Advisory Given, Teeth Intact   Pulmonary    Pulmonary exam normal breath sounds clear to auscultation       Cardiovascular Normal cardiovascular exam Rhythm:Regular Rate:Normal     Neuro/Psych    GI/Hepatic   Endo/Other    Renal/GU      Musculoskeletal   Abdominal (+) + obese,   Peds  Hematology   Anesthesia Other Findings   Reproductive/Obstetrics                             Anesthesia Physical Anesthesia Plan  ASA: III  Anesthesia Plan: General   Post-op Pain Management:    Induction: Intravenous  PONV Risk Score and Plan: 4 or greater and Dexamethasone, Ondansetron, Midazolam and Treatment may vary due to age or medical condition  Airway Management Planned: Oral ETT  Additional Equipment: None  Intra-op Plan:   Post-operative Plan: Extubation in OR  Informed Consent: I have reviewed the patients History and Physical, chart, labs and discussed the procedure including the risks, benefits and alternatives for the proposed anesthesia with the patient or authorized representative who has indicated his/her understanding and acceptance.     Dental advisory given  Plan Discussed with: CRNA  Anesthesia Plan Comments:         Anesthesia Quick Evaluation

## 2019-04-22 NOTE — OR Nursing (Signed)
Tonsils (in saline) picked up per Asencion Partridge, Brandon Coordinator.  Permission/Consent form with patient's signature with paperwork.

## 2019-04-23 ENCOUNTER — Encounter (HOSPITAL_BASED_OUTPATIENT_CLINIC_OR_DEPARTMENT_OTHER): Payer: Self-pay | Admitting: Otolaryngology

## 2019-05-09 ENCOUNTER — Ambulatory Visit (INDEPENDENT_AMBULATORY_CARE_PROVIDER_SITE_OTHER): Payer: 59 | Admitting: Otolaryngology

## 2020-06-29 ENCOUNTER — Encounter: Payer: Self-pay | Admitting: Family Medicine

## 2020-06-29 NOTE — Telephone Encounter (Signed)
Nurses-although it is possible that the stress of sickness could cause hair loss it would be wise to check for other things as well please set up follow-up visit with me or with Clydie Braun

## 2020-06-30 ENCOUNTER — Ambulatory Visit (INDEPENDENT_AMBULATORY_CARE_PROVIDER_SITE_OTHER): Payer: 59 | Admitting: Family Medicine

## 2020-06-30 ENCOUNTER — Encounter: Payer: Self-pay | Admitting: Family Medicine

## 2020-06-30 ENCOUNTER — Other Ambulatory Visit: Payer: Self-pay

## 2020-06-30 VITALS — HR 105 | Wt 285.6 lb

## 2020-06-30 DIAGNOSIS — J3489 Other specified disorders of nose and nasal sinuses: Secondary | ICD-10-CM

## 2020-06-30 DIAGNOSIS — R0981 Nasal congestion: Secondary | ICD-10-CM

## 2020-06-30 DIAGNOSIS — L659 Nonscarring hair loss, unspecified: Secondary | ICD-10-CM | POA: Insufficient documentation

## 2020-06-30 MED ORDER — FLUTICASONE PROPIONATE 50 MCG/ACT NA SUSP: 2.0000 | Freq: Every day | NASAL | 1 refills | Status: AC

## 2020-06-30 NOTE — Progress Notes (Signed)
Patient ID: Candace Maxwell, female    DOB: Dec 06, 1990, 29 y.o.   MRN: [id number]   Chief Complaint  Patient presents with  . Cough   Subjective:  CC: "I'm losing my hair" and I have sinus congestion  This is a new problem.  Complaint of cough and congestion x1 week, managing with over-the-counter DayQuil/NyQuil.  Thinks it might be sinus related.  Had Covid in September, recovered in October begetting November started noticing hair coming out in clumps.  This is extremely distressful, causing some anxiety and depression.  Denies fever, chills, chest pain, shortness of breath, ear pain, abdominal pain.  Has tried gummy vitamins for hair loss. Has several friends/family who also had Covid who are experiencing the same issue with hair loss.     Cough This is a new problem. Episode onset: 1 week ago. The cough is productive of sputum. Associated symptoms include nasal congestion and rhinorrhea. Pertinent negatives include no chest pain, chills, ear pain, fever, sore throat or shortness of breath. Treatments tried: OTC gummies for congestion, dayquil, nyquil. Patient had covid in September- she started noticing hair loss end of October/beginning of November. Reports hair coming out in clumps. Has not used any new products.      Medical History Candace Maxwell has no past medical history on file.   Outpatient Encounter Medications as of 06/30/2020  Medication Sig  . albuterol (PROVENTIL HFA;VENTOLIN HFA) 108 (90 Base) MCG/ACT inhaler Inhale 2 puffs into the lungs every 6 (six) hours as needed for wheezing or shortness of breath.  . diphenhydrAMINE (BENADRYL) 25 mg capsule Take 25 mg by mouth every 6 (six) hours as needed.  Marland Kitchen EPIDUO 0.1-2.5 % gel APPLY AT BEDTIME.  . famotidine (PEPCID) 10 MG tablet Take 10 mg by mouth 2 (two) times daily.  . fluticasone (FLONASE) 50 MCG/ACT nasal spray Place 2 sprays into both nostrils daily.  . [DISCONTINUED] predniSONE (DELTASONE) 20 MG tablet 3qd for 2d then 2qd  for 2d then 1qd for 2d   No facility-administered encounter medications on file as of 06/30/2020.     Review of Systems  Constitutional: Negative for chills and fever.  HENT: Positive for congestion and rhinorrhea. Negative for ear pain, sinus pressure, sinus pain and sore throat.   Respiratory: Positive for cough. Negative for shortness of breath.   Cardiovascular: Negative for chest pain.  Gastrointestinal: Negative for abdominal pain.  Skin:       Hair loss.      Vitals Pulse (!) 105   Wt 285 lb 9.6 oz (129.5 kg)   SpO2 97%   BMI 47.53 kg/m   Objective:   Physical Exam Vitals and nursing note reviewed.  Constitutional:      General: She is not in acute distress.    Appearance: Normal appearance. She is not toxic-appearing.  HENT:     Head: Normocephalic.     Nose: Rhinorrhea present.     Mouth/Throat:     Mouth: Mucous membranes are moist.     Pharynx: Oropharynx is clear.  Cardiovascular:     Rate and Rhythm: Normal rate and regular rhythm.     Heart sounds: Normal heart sounds.  Pulmonary:     Effort: Pulmonary effort is normal.     Breath sounds: Normal breath sounds.  Skin:    General: Skin is warm and dry.  Neurological:     Mental Status: She is alert and oriented to person, place, and time.  Psychiatric:     Comments:  Feels depressed due to hair loss.       Assessment and Plan   1. Hair loss - TSH - Iron Binding Cap (TIBC)([facility]) - Ferritin - Ambulatory referral to Dermatology  2. Nasal congestion with rhinorrhea - fluticasone (FLONASE) 50 MCG/ACT nasal spray; Place 2 sprays into both nostrils daily.  Dispense: 16 g; Refill: 1   Concerned this is residual from Covid infection. Will check labs to be sure there isn't a physical cause.  Derm referral placed.   Nasal congestion: Flonase. Will cotinue with OTC medications. No indication today there is a sinus infection.  Agrees with plan of care discussed today. Understands warning  signs to seek further care: cheat pain, SOB, any significant changes. Understands to follow-up with lab results once available. Referral placed to derm for hair loss.

## 2020-07-01 LAB — TSH: TSH: 1.13 u[IU]/mL (ref 0.450–4.500)

## 2020-07-01 LAB — FERRITIN: Ferritin: 160 ng/mL — ABNORMAL HIGH (ref 15–150)

## 2020-07-01 LAB — IRON AND TIBC
Iron Saturation: 23 % (ref 15–55)
Iron: 71 ug/dL (ref 27–159)
Total Iron Binding Capacity: 313 ug/dL (ref 250–450)
UIBC: 242 ug/dL (ref 131–425)

## 2020-12-31 ENCOUNTER — Other Ambulatory Visit: Payer: Self-pay | Admitting: Nurse Practitioner

## 2020-12-31 ENCOUNTER — Telehealth: Payer: 59

## 2020-12-31 MED ORDER — FLUCONAZOLE 150 MG PO TABS: ORAL_TABLET | ORAL | 0 refills | Status: DC

## 2020-12-31 NOTE — Telephone Encounter (Signed)
Wants to see if something can be called in for yeast infection. [facility]   Pt call back [phone]

## 2020-12-31 NOTE — Telephone Encounter (Signed)
Pt having vaginal itching, no pelvic pain, no discharge, no fever. Wonders if diflucan can be called in

## 2020-12-31 NOTE — Telephone Encounter (Signed)
Done

## 2021-04-05 ENCOUNTER — Telehealth: Payer: Self-pay | Admitting: Family Medicine

## 2021-04-05 MED ORDER — FLUCONAZOLE 150 MG PO TABS: ORAL_TABLET | ORAL | 0 refills | Status: DC

## 2021-04-05 NOTE — Telephone Encounter (Signed)
Patient is requesting something for yeast infection called into [facility]

## 2021-04-05 NOTE — Telephone Encounter (Signed)
Pt contacted. Pt states she is having itching. Pt states that each time she gets her cycle she begins to itch. Not sure if it is the pad that is irritating her or what; pt advised that if Diflucan does not clear it up please contact office to be seen. Pt verbalized understanding.

## 2021-04-22 ENCOUNTER — Encounter: Payer: Self-pay | Admitting: Adult Health

## 2021-05-05 ENCOUNTER — Ambulatory Visit: Payer: 59 | Admitting: Adult Health

## 2021-05-05 ENCOUNTER — Other Ambulatory Visit: Payer: Self-pay

## 2021-05-05 ENCOUNTER — Encounter: Payer: Self-pay | Admitting: Adult Health

## 2021-05-05 VITALS — BP 181/124 | HR 101 | Ht 65.0 in | Wt 295.0 lb

## 2021-05-05 DIAGNOSIS — N921 Excessive and frequent menstruation with irregular cycle: Secondary | ICD-10-CM | POA: Diagnosis not present

## 2021-05-05 DIAGNOSIS — I10 Essential (primary) hypertension: Secondary | ICD-10-CM

## 2021-05-05 LAB — POCT URINE PREGNANCY: Preg Test, Ur: NEGATIVE

## 2021-05-05 MED ORDER — MEGESTROL ACETATE 40 MG PO TABS: ORAL_TABLET | ORAL | 1 refills | Status: DC

## 2021-05-05 MED ORDER — LOSARTAN POTASSIUM-HCTZ 50-12.5 MG PO TABS: 1.0000 | ORAL_TABLET | Freq: Every day | ORAL | 3 refills | Status: DC

## 2021-05-05 NOTE — Progress Notes (Signed)
  Subjective:     Patient ID: Candace Maxwell, female   DOB: [birth year], 30 y.o.   MRN: [id number]  HPI Candace Maxwell is a 30 year old black female, single with DP, G0P0, in complaining of bleeding, on and off since end of August. She is working at [employer] now. Lab Results  Component Value Date   DIAGPAP  12/31/2018    NEGATIVE FOR INTRAEPITHELIAL LESIONS OR MALIGNANCY.   PCP is [facility].  Review of Systems Having heavy irregular periods, will be heavy then spot then heavy again +clots Reviewed past medical,surgical, social and family history. Reviewed medications and allergies.     Objective:   Physical Exam BP (!) 181/124 (BP Location: Left Arm, Patient Position: Sitting, Cuff Size: Large)   Pulse (!) 101   Ht 5\' 5"  (1.651 m)   Wt 295 lb (133.8 kg)   LMP 04/18/2021 (Approximate) Comment: Still spotting  BMI 49.09 kg/m  UPT is negative.Skin warm and dry. Lungs: clear to ausculation bilaterally. Cardiovascular: regular rate and rhythm.    Pelvic: external genitalia is normal in appearance no lesions, vagina: pink and moist,urethra has no lesions or masses noted, cervix:smooth, uterus: normal size, shape and contour, non tender, no masses felt, adnexa: no masses or tenderness noted. Bladder is non tender and no masses felt. Has 1+PE BLE Fall risk is low  Upstream - 05/05/21 0857       Pregnancy Intention Screening   Does the patient want to become pregnant in the next year? Yes    Does the patient's partner want to become pregnant in the next year? Yes    Would the patient like to discuss contraceptive options today? No      Contraception Wrap Up   Current Method Pregnant/Seeking Pregnancy    End Method Pregnant/Seeking Pregnancy    Contraception Counseling Provided No            Examination chaperoned by 07/05/21.  Assessment:     1. Menorrhagia with irregular cycle UPT is negative Will check CBC and TSH Will get pelvic [employer] 10/18 at 12:30 pm at [facility] to assess  uterus and ovaried Will Rx megace to stop bleeding  Meds ordered this encounter  Medications   losartan-hydrochlorothiazide (HYZAAR) 50-12.5 MG tablet    Sig: Take 1 tablet by mouth daily.    Dispense:  30 tablet    Refill:  3    Order Specific Question:   Supervising Provider    Answer:   [facility] H [[id number]]   megestrol (MEGACE) 40 MG tablet    Sig: Take 3 x 5 days then 2 x 5 days then 1 daily til bleeding stops    Dispense:  45 tablet    Refill:  1    Order Specific Question:   Supervising Provider    Answer:   Duane Lope, LUTHER H [[id number]]    2. Hypertension, unspecified type Will rx hyzaar and DASH diet Review handout on DASH diet Try to lose 1-2 lbs a week Will check CMP  3. Morbid obesity (HCC) Try to starting working on weight, will help BP and also if decides to try to get pregnant    Plan:     Follow up in 8 weeks for BP check and ROS

## 2021-05-06 LAB — COMPREHENSIVE METABOLIC PANEL
ALT: 37 IU/L — ABNORMAL HIGH (ref 0–32)
AST: 33 IU/L (ref 0–40)
Albumin/Globulin Ratio: 1.7 (ref 1.2–2.2)
Albumin: 4.4 g/dL (ref 3.9–5.0)
Alkaline Phosphatase: 83 IU/L (ref 44–121)
BUN/Creatinine Ratio: 14 (ref 9–23)
BUN: 10 mg/dL (ref 6–20)
Bilirubin Total: 0.5 mg/dL (ref 0.0–1.2)
CO2: 20 mmol/L (ref 20–29)
Calcium: 9 mg/dL (ref 8.7–10.2)
Chloride: 105 mmol/L (ref 96–106)
Creatinine, Ser: 0.7 mg/dL (ref 0.57–1.00)
Globulin, Total: 2.6 g/dL (ref 1.5–4.5)
Glucose: 120 mg/dL — ABNORMAL HIGH (ref 70–99)
Potassium: 4.3 mmol/L (ref 3.5–5.2)
Sodium: 139 mmol/L (ref 134–144)
Total Protein: 7 g/dL (ref 6.0–8.5)
eGFR: 120 mL/min/{1.73_m2} (ref >59)

## 2021-05-06 LAB — CBC
Hematocrit: 38.7 % (ref 34.0–46.6)
Hemoglobin: 13.3 g/dL (ref 11.1–15.9)
MCH: 31.7 pg (ref 26.6–33.0)
MCHC: 34.4 g/dL (ref 31.5–35.7)
MCV: 92 fL (ref 79–97)
Platelets: 273 10*3/uL (ref 150–450)
RBC: 4.19 x10E6/uL (ref 3.77–5.28)
RDW: 11.9 % (ref 11.7–15.4)
WBC: 6.9 10*3/uL (ref 3.4–10.8)

## 2021-05-06 LAB — TSH: TSH: 1.16 u[IU]/mL (ref 0.450–4.500)

## 2021-05-11 ENCOUNTER — Ambulatory Visit (HOSPITAL_COMMUNITY)
Admission: RE | Admit: 2021-05-11 | Discharge: 2021-05-11 | Disposition: A | Discharge status: Home / Self Care | Payer: 59 | Source: Ambulatory Visit | Attending: Adult Health | Admitting: Adult Health

## 2021-05-11 ENCOUNTER — Other Ambulatory Visit: Payer: Self-pay

## 2021-05-11 DIAGNOSIS — N921 Excessive and frequent menstruation with irregular cycle: Secondary | ICD-10-CM | POA: Insufficient documentation

## 2021-06-02 ENCOUNTER — Other Ambulatory Visit: Payer: 59 | Admitting: Adult Health

## 2021-06-09 ENCOUNTER — Other Ambulatory Visit: Payer: 59 | Admitting: Adult Health

## 2021-06-30 ENCOUNTER — Ambulatory Visit (INDEPENDENT_AMBULATORY_CARE_PROVIDER_SITE_OTHER): Payer: 59 | Admitting: Adult Health

## 2021-06-30 ENCOUNTER — Encounter: Payer: Self-pay | Admitting: Adult Health

## 2021-06-30 ENCOUNTER — Other Ambulatory Visit: Payer: Self-pay

## 2021-06-30 VITALS — BP 150/96 | HR 103 | Ht 65.0 in | Wt 291.2 lb

## 2021-06-30 DIAGNOSIS — I10 Essential (primary) hypertension: Secondary | ICD-10-CM | POA: Diagnosis not present

## 2021-06-30 DIAGNOSIS — F32A Depression, unspecified: Secondary | ICD-10-CM

## 2021-06-30 DIAGNOSIS — N921 Excessive and frequent menstruation with irregular cycle: Secondary | ICD-10-CM | POA: Diagnosis not present

## 2021-06-30 DIAGNOSIS — Z01419 Encounter for gynecological examination (general) (routine) without abnormal findings: Secondary | ICD-10-CM | POA: Insufficient documentation

## 2021-06-30 DIAGNOSIS — F419 Anxiety disorder, unspecified: Secondary | ICD-10-CM

## 2021-06-30 MED ORDER — ESCITALOPRAM OXALATE 10 MG PO TABS: 10.0000 mg | ORAL_TABLET | Freq: Every day | ORAL | 3 refills | Status: DC

## 2021-06-30 MED ORDER — MEGESTROL ACETATE 40 MG PO TABS: ORAL_TABLET | ORAL | 1 refills | Status: DC

## 2021-06-30 MED ORDER — LOSARTAN POTASSIUM-HCTZ 100-25 MG PO TABS: 1.0000 | ORAL_TABLET | Freq: Every day | ORAL | 1 refills | Status: DC

## 2021-06-30 NOTE — Progress Notes (Signed)
Patient ID: Candace Maxwell, female   DOB: 1990/08/04, 30 y.o.   MRN: [id number] History of Present Illness: Candace Maxwell is a 30 year old black female,with DP, G0P0, in for a well woman gyn exam and follow up on BP and bleeding. Has stress at work.bleeding is better.  Lab Results  Component Value Date   DIAGPAP  12/31/2018    NEGATIVE FOR INTRAEPITHELIAL LESIONS OR MALIGNANCY.   PCP is [facility].  Current Medications, Allergies, Past Medical History, Past Surgical History, Family History and Social History were reviewed in [name] record.     Review of Systems: Patient denies any headaches, hearing loss, fatigue, blurred vision, shortness of breath, chest pain, abdominal pain, problems with bowel movements, urination, or intercourse. No joint pain or mood swings.  Bleeding better +stress at work   Physical Exam:BP (!) 150/96 (BP Location: Right Arm, Patient Position: Sitting, Cuff Size: Large)   Pulse (!) 103   Ht 5\' 5"  (1.651 m)   Wt 291 lb 3.2 oz (132.1 kg)   LMP 06/09/2021 (Exact Date)   BMI 48.46 kg/m   General:  Well developed, well nourished, no acute distress Skin:  Warm and dry Neck:  Midline trachea, normal thyroid, good ROM, no lymphadenopathy Lungs; Clear to auscultation bilaterally Breast:  No dominant palpable mass, retraction, or nipple discharge Cardiovascular: Regular rate and rhythm Abdomen:  Soft, non tender, no hepatosplenomegaly Pelvic:  External genitalia is normal in appearance, no lesions.  The vagina is normal in appearance. Urethra has no lesions or masses. The cervix is smooth.  Uterus is felt to be normal size, shape, and contour.  No adnexal masses or tenderness noted.Bladder is non tender, no masses felt. Rectal: Deferred. Extremities/musculoskeletal:  No swelling or varicosities noted, no clubbing or cyanosis Psych:  No mood changes, alert and cooperative,seems happy AA is 1 Fall risk is low Depression screen [facility] 2/9  06/30/2021 06/30/2020 10/25/2016  Decreased Interest 2 0 2  Down, Depressed, Hopeless 3 0 2  PHQ - 2 Score 5 0 4  Altered sleeping 2 - 1  Tired, decreased energy 2 - 1  Change in appetite 0 - 0  Feeling bad or failure about yourself  2 - 1  Trouble concentrating 0 - 0  Moving slowly or fidgety/restless 0 - 0  Suicidal thoughts 0 - 1  PHQ-9 Score 11 - 8    GAD 7 : Generalized Anxiety Score 06/30/2021  Nervous, Anxious, on Edge 1  Control/stop worrying 2  Worry too much - different things 2  Trouble relaxing 1  Restless 0  Easily annoyed or irritable 3  Afraid - awful might happen 0  Total GAD 7 Score 9    [facility] - 06/30/21 0941       Pregnancy Intention Screening   Does the patient want to become pregnant in the next year? Yes    Does the patient's partner want to become pregnant in the next year? Yes    Would the patient like to discuss contraceptive options today? No      Contraception Wrap Up   Current Method Pregnant/Seeking Pregnancy    End Method Pregnant/Seeking Pregnancy    Contraception Counseling Provided No            Examination chaperoned by 14/07/22 LPN   Impression and Plan: 1. Encounter for well woman exam with routine gynecological exam Pap wnt physical in 1 year   2. Hypertension, unspecified type Will increase hyzaar Meds ordered this  encounter  Medications   losartan-hydrochlorothiazide (HYZAAR) 100-25 MG tablet    Sig: Take 1 tablet by mouth daily.    Dispense:  90 tablet    Refill:  1    Order Specific Question:   Supervising Provider    Answer:   Duane Lope H [[id number]]   megestrol (MEGACE) 40 MG tablet    Sig: Take 1-2 daily till bleeding stops    Dispense:  60 tablet    Refill:  1    Order Specific Question:   Supervising Provider    Answer:   Despina Hidden, LUTHER H [[id number]]   escitalopram (LEXAPRO) 10 MG tablet    Sig: Take 1 tablet (10 mg total) by mouth daily.    Dispense:  30 tablet    Refill:  3    Order Specific Question:    Supervising Provider    Answer:   Duane Lope H [[id number]]   Follow up in 10 weeks for recheck on BP and ROS   3. Menorrhagia with irregular cycle Continue megace   4. Anxiety and depression Will try lexapro Take time for self Has decreased work from 70 hours to 60 and may try to do less  Follow up in 10 weeks for ROS

## 2021-08-25 ENCOUNTER — Other Ambulatory Visit: Payer: Self-pay | Admitting: Adult Health

## 2021-09-08 ENCOUNTER — Other Ambulatory Visit: Payer: Self-pay

## 2021-09-08 ENCOUNTER — Ambulatory Visit (INDEPENDENT_AMBULATORY_CARE_PROVIDER_SITE_OTHER): Payer: 59 | Admitting: Adult Health

## 2021-09-08 ENCOUNTER — Encounter: Payer: Self-pay | Admitting: Adult Health

## 2021-09-08 VITALS — BP 154/101 | HR 93 | Ht 65.0 in | Wt 292.2 lb

## 2021-09-08 DIAGNOSIS — I10 Essential (primary) hypertension: Secondary | ICD-10-CM | POA: Diagnosis not present

## 2021-09-08 DIAGNOSIS — F419 Anxiety disorder, unspecified: Secondary | ICD-10-CM

## 2021-09-08 DIAGNOSIS — F32A Depression, unspecified: Secondary | ICD-10-CM

## 2021-09-08 MED ORDER — AMLODIPINE BESYLATE 5 MG PO TABS: 5.0000 mg | ORAL_TABLET | Freq: Every day | ORAL | 3 refills | Status: DC

## 2021-09-08 MED ORDER — SERTRALINE HCL 50 MG PO TABS: 50.0000 mg | ORAL_TABLET | Freq: Every day | ORAL | 6 refills | Status: DC

## 2021-09-08 NOTE — Progress Notes (Signed)
Subjective:     Patient ID: Candace Maxwell, female   DOB: 18-Oct-1990, 31 y.o.   MRN: [id number]  HPI Candace Maxwell is a 31 year old black female, with DP, G0P0, back in follow up on BP and has not taken meds in a day or 2, and lexapro. BP still elevated at home 150/100s.  Lab Results  Component Value Date   DIAGPAP  12/31/2018    NEGATIVE FOR INTRAEPITHELIAL LESIONS OR MALIGNANCY.   PCP is Lilyan Punt MD.  Review of Systems +headache at times,uses Excedrin which helps Irregular bleeding,uses megace to stop Sleepy with lexapro if takes in am and nightmares if takes at night  Reviewed past medical,surgical, social and family history. Reviewed medications and allergies.     Objective:   Physical Exam BP (!) 154/101 (BP Location: Right Arm, Patient Position: Sitting, Cuff Size: Normal)    Pulse 93    Ht 5\' 5"  (1.651 m)    Wt 292 lb 3.2 oz (132.5 kg)    LMP 08/23/2021 (Exact Date)    BMI 48.62 kg/m     Skin warm and dry. Lungs: clear to ausculation bilaterally. Cardiovascular: regular rate and rhythm. 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  Depression screen [facility] 2/9 09/08/2021 06/30/2021 06/30/2020  Decreased Interest 2 2 0  Down, Depressed, Hopeless 2 3 0  PHQ - 2 Score 4 5 0  Altered sleeping 3 2 -  Tired, decreased energy 3 2 -  Change in appetite 0 0 -  Feeling bad or failure about yourself  2 2 -  Trouble concentrating 1 0 -  Moving slowly or fidgety/restless 2 0 -  Suicidal thoughts 1 0 -  PHQ-9 Score 16 11 -    GAD 7 : Generalized Anxiety Score 09/08/2021 06/30/2021  Nervous, Anxious, on Edge 3 1  Control/stop worrying 2 2  Worry too much - different things 1 2  Trouble relaxing 0 1  Restless 0 0  Easily annoyed or irritable 3 3  Afraid - awful might happen 1 0  Total GAD 7 Score 10 9      Upstream - 09/08/21 0948       Pregnancy Intention Screening   Does the patient want to become pregnant in the next year? Yes    Does the patient's partner want to become pregnant in  the next year? Yes    Would the patient like to discuss contraceptive options today? No      Contraception Wrap Up   Current Method Pregnant/Seeking Pregnancy    End Method Pregnant/Seeking Pregnancy    Contraception Counseling Provided No             Assessment:     1. Hypertension, unspecified type Continue Hyzaar and will add Norvasc Meds ordered this encounter  Medications   amLODipine (NORVASC) 5 MG tablet    Sig: Take 1 tablet (5 mg total) by mouth daily.    Dispense:  30 tablet    Refill:  3    Order Specific Question:   Supervising Provider    Answer:   09/10/21 H [[id number]]   sertraline (ZOLOFT) 50 MG tablet    Sig: Take 1 tablet (50 mg total) by mouth daily.    Dispense:  30 tablet    Refill:  6    Order Specific Question:   Supervising Provider    Answer:   Duane Lope, LUTHER H [[id number]]     2. Anxiety and depression Will stop lexapro and try Zoloft  She is also changing jobs, with more regular schedule, will be at [facility] now  3. Morbid obesity (HCC) Keep working on weight loss Doing intermittent fasting     Plan:     Follow up in 6 weeks for BP check and ROS on zoloft

## 2021-10-22 ENCOUNTER — Ambulatory Visit (INDEPENDENT_AMBULATORY_CARE_PROVIDER_SITE_OTHER): Payer: Self-pay | Admitting: Adult Health

## 2021-10-22 ENCOUNTER — Encounter: Payer: Self-pay | Admitting: Adult Health

## 2021-10-22 VITALS — BP 127/87 | HR 89 | Ht 65.0 in | Wt 289.4 lb

## 2021-10-22 DIAGNOSIS — F419 Anxiety disorder, unspecified: Secondary | ICD-10-CM

## 2021-10-22 DIAGNOSIS — I10 Essential (primary) hypertension: Secondary | ICD-10-CM

## 2021-10-22 DIAGNOSIS — F32A Depression, unspecified: Secondary | ICD-10-CM

## 2021-10-22 MED ORDER — AMLODIPINE BESYLATE 5 MG PO TABS: 5.0000 mg | ORAL_TABLET | Freq: Every day | ORAL | 3 refills | Status: DC

## 2021-10-22 NOTE — Progress Notes (Signed)
?Subjective:  ?  ? Patient ID: Candace Maxwell, female   DOB: [birth year], 31 y.o.   MRN: [id number] ? ?HPI ?Candace Maxwell is a 31 year old black female, with DP, G0P0, in for BP check and follow up on switching to Zoloft and is better. Likes her new job. ?PCP is Candace Punt MD ?Lab Results  ?Component Value Date  ? DIAGPAP  12/31/2018  ?  NEGATIVE FOR INTRAEPITHELIAL LESIONS OR MALIGNANCY.  ?  ?Review of Systems ?Feels better  ?Not as stressed ?Reviewed past medical,surgical, social and family history. Reviewed medications and allergies.  ?   ?Objective:  ? Physical Exam ?BP 127/87 (BP Location: Right Arm, Patient Position: Sitting, Cuff Size: Large)   Pulse 89   Ht 5\' 5"  (1.651 m)   Wt 289 lb 6.4 oz (131.3 kg)   LMP 09/29/2021 (Approximate)   BMI 48.16 kg/m?   has lost 3 lbs ?Skin warm and dry.  Lungs: clear to ausculation bilaterally. Cardiovascular: regular rate and rhythm.  ?   ? Upstream - 10/22/21 0859   ? ?  ? Pregnancy Intention Screening  ? Does the patient want to become pregnant in the next year? Yes   ? Does the patient's partner want to become pregnant in the next year? Yes   ? Would the patient like to discuss contraceptive options today? No   ?  ? Contraception Wrap Up  ? Current Method Pregnant/Seeking Pregnancy   ? End Method Pregnant/Seeking Pregnancy   ? Contraception Counseling Provided No   ? ?  ?  ? ?  ?  ? ?  10/22/2021  ?  8:58 AM 09/08/2021  ?  9:48 AM 06/30/2021  ?  9:42 AM  ?Depression screen PHQ 2/9  ?Decreased Interest 1 2 2   ?Down, Depressed, Hopeless 1 2 3   ?PHQ - 2 Score 2 4 5   ?Altered sleeping 0 3 2  ?Tired, decreased energy 1 3 2   ?Change in appetite 1 0 0  ?Feeling bad or failure about yourself  1 2 2   ?Trouble concentrating 2 1 0  ?Moving slowly or fidgety/restless 0 2 0  ?Suicidal thoughts 1 1 0  ?PHQ-9 Score 8 16 11   ?  ? ?  10/22/2021  ?  8:59 AM 09/08/2021  ?  9:48 AM 06/30/2021  ?  9:42 AM  ?GAD 7 : Generalized Anxiety Score  ?Nervous, Anxious, on Edge 1 3 1   ?Control/stop  worrying 2 2 2   ?Worry too much - different things 2 1 2   ?Trouble relaxing 1 0 1  ?Restless 0 0 0  ?Easily annoyed or irritable 1 3 3   ?Afraid - awful might happen 2 1 0  ?Total GAD 7 Score 9 10 9   ? ?  ?Assessment:  ?   ?1. Hypertension, unspecified type ?Continue Norvasc 5 mg daily and Hyzaar 100-25 mg 1 daily ?Meds ordered this encounter  ?Medications  ? amLODipine (NORVASC) 5 MG tablet  ?  Sig: Take 1 tablet (5 mg total) by mouth daily.  ?  Dispense:  30 tablet  ?  Refill:  3  ?  Order Specific Question:   Supervising Provider  ?  Answer:   H [[id number]]  ?  ? ?2. Anxiety and depression ?Will continue Zoloft 50 mg 1 daily, has refills  ? ?3. Morbid obesity (HCC) ?Continue working on weight ?   ?Plan:  ?   ?Follow up about 01/11/22 for BP check and ROS and pap ?   ?

## 2021-11-30 ENCOUNTER — Other Ambulatory Visit: Payer: Self-pay | Admitting: Adult Health

## 2021-12-01 ENCOUNTER — Other Ambulatory Visit: Payer: Self-pay | Admitting: Adult Health

## 2021-12-03 ENCOUNTER — Other Ambulatory Visit: Payer: Self-pay | Admitting: Adult Health

## 2021-12-17 ENCOUNTER — Ambulatory Visit
Admission: EM | Admit: 2021-12-17 | Discharge: 2021-12-17 | Disposition: A | Discharge status: Home / Self Care | Payer: Commercial Managed Care - PPO | Attending: Nurse Practitioner | Admitting: Nurse Practitioner

## 2021-12-17 DIAGNOSIS — M654 Radial styloid tenosynovitis [de Quervain]: Secondary | ICD-10-CM | POA: Diagnosis not present

## 2021-12-17 MED ORDER — PREDNISONE 20 MG PO TABS: 40.0000 mg | ORAL_TABLET | Freq: Every day | ORAL | 0 refills | Status: AC

## 2021-12-17 NOTE — Discharge Instructions (Addendum)
Take medication as prescribed. Gentle range of motion and movement of the affected joint is recommended.  Try to stay mobile as this will help decrease your recovery time. May apply ice or heat as needed.  Ice for pain or swelling, heat for spasm or stiffness.  Apply for 20 minutes, remove for 1 hour, then repeat. If your symptoms do not improve, as discussed, you will most likely need follow-up with a hand specialist.  If necessary, you can follow-up with Ortho care at [facility] with Dr. Romeo Apple.  You can call them at [phone].

## 2021-12-17 NOTE — ED Triage Notes (Signed)
Pt reports right arm x 1 day. Tylenol and ibuprofen gives no relief. Denies any injury.

## 2021-12-17 NOTE — ED Provider Notes (Signed)
[facility]    CSN: [id number] Arrival date & time: 12/17/21  1140      History   Chief Complaint Chief Complaint  Patient presents with   Arm Pain    HPI Candace Maxwell is a 31 y.o. female.   The patient is a 31 year old female who presents with right arm pain.  Patient states pain starts in the right thumb.  With certain movement, pain radiates into the right forearm up into the elbow.  Patient describes the pain as a "ache".  She has numbness and tingling at baseline of her hands, does not notice any difference since her symptoms started in the right arm.  She states that she does use her right hand a lot.  She is also right-hand-dominant.  She denies any injury, trauma, swelling.  Worsens with lifting and gripping.  She has been taking ibuprofen and Tylenol for her symptoms without good relief.  The history is provided by the patient.   History reviewed. No pertinent past medical history.  Patient Active Problem List   Diagnosis Date Noted   Anxiety and depression 06/30/2021   Encounter for well woman exam with routine gynecological exam 06/30/2021   Menorrhagia with irregular cycle 05/05/2021   Hypertension 05/05/2021   Morbid obesity (HCC) 05/05/2021   Hair loss 06/30/2020   Nasal congestion with rhinorrhea 06/30/2020   Allergic rhinitis 11/17/2013   Acne 09/09/2013    Past Surgical History:  Procedure Laterality Date   CHOLECYSTECTOMY  1 1 2012   TONSILLECTOMY AND ADENOIDECTOMY Bilateral 04/22/2019   Procedure: TONSILLECTOMY AND ADENOIDECTOMY;  Surgeon: Newman Pieseoh, Su, MD;  Location: [facility];  Service: ENT;  Laterality: Bilateral;    OB History     Gravida  0   Para  0   Term  0   Preterm  0   AB  0   Living  0      SAB  0   IAB  0   Ectopic  0   Multiple  0   Live Births  0            Home Medications    Prior to Admission medications   Medication Sig Start Date End Date Taking? Authorizing Provider   predniSONE (DELTASONE) 20 MG tablet Take 2 tablets (40 mg total) by mouth daily with breakfast for 5 days. 12/17/21 12/22/21 Yes Camora Tremain-Warren, Sadie Haberhristie J, NP  albuterol (PROVENTIL HFA;VENTOLIN HFA) 108 (90 Base) MCG/ACT inhaler Inhale 2 puffs into the lungs every 6 (six) hours as needed for wheezing or shortness of breath. 11/10/15   Merlyn AlbertLuking, William S, MD  amLODipine (NORVASC) 5 MG tablet TAKE 1 TABLET BY MOUTH EVERY DAY 12/01/21   Cyril MourningGriffin, Jennifer A, NP  diphenhydrAMINE (BENADRYL) 25 mg capsule Take 25 mg by mouth every 6 (six) hours as needed.    [provider]  fluticasone (FLONASE) 50 MCG/ACT nasal spray Place 2 sprays into both nostrils daily. 06/30/20   Novella Oliveolby, Karen R, NP  losartan-hydrochlorothiazide (HYZAAR) 100-25 MG tablet TAKE 1 TABLET BY MOUTH EVERY DAY 12/03/21   Adline PotterGriffin, Jennifer A, NP  megestrol (MEGACE) 40 MG tablet TAKE 1-2 TABLETS BY MOUTH EVERY DAY UNTIL BLEEDING STOPS 11/30/21   Adline PotterGriffin, Jennifer A, NP  sertraline (ZOLOFT) 50 MG tablet Take 1 tablet (50 mg total) by mouth daily. 09/08/21   Adline PotterGriffin, Jennifer A, NP    Family History Family History  Problem Relation Age of Onset   Uterine cancer Paternal Grandmother  Alzheimer's disease Paternal Grandmother    Hypertension Father    Healthy Father    Hypertension Mother    Healthy Mother    Hypertension Sister     Social History Social History   Tobacco Use   Smoking status: Never   Smokeless tobacco: Never  Vaping Use   Vaping Use: Never used  Substance Use Topics   Alcohol use: Never   Drug use: Never     Allergies   Cauliflower [brassica oleracea] and Cefdinir   Review of Systems Review of Systems PER HPI  Physical Exam Triage Vital Signs ED Triage Vitals  Enc Vitals Group     BP 12/17/21 1200 (!) 158/107     Pulse Rate 12/17/21 1200 (!) 103     Resp 12/17/21 1200 18     Temp 12/17/21 1200 98.5 F (36.9 C)     Temp Source 12/17/21 1200 Oral     SpO2 12/17/21 1200 97 %     Weight --       Height --      Head Circumference --      Peak Flow --      Pain Score 12/17/21 1159 7     Pain Loc --      Pain Edu? --      Excl. in GC? --    No data found.  Updated Vital Signs BP (!) 158/107 (BP Location: Right Arm)   Pulse (!) 103   Temp 98.5 F (36.9 C) (Oral)   Resp 18   LMP 11/25/2021 (Exact Date)   SpO2 97%   Visual Acuity Right Eye Distance:   Left Eye Distance:   Bilateral Distance:    Right Eye Near:   Left Eye Near:    Bilateral Near:     Physical Exam Vitals and nursing note reviewed.  Constitutional:      Appearance: Normal appearance.  HENT:     Head: Normocephalic.  Cardiovascular:     Rate and Rhythm: Normal rate and regular rhythm.     Pulses: Normal pulses.     Heart sounds: Normal heart sounds.  Pulmonary:     Effort: Pulmonary effort is normal.     Breath sounds: Normal breath sounds.  Abdominal:     General: Bowel sounds are normal.     Palpations: Abdomen is soft.  Musculoskeletal:     Right hand: No swelling. Decreased range of motion. Normal strength. Normal strength of thumb/finger opposition. Normal sensation. There is no disruption of two-point discrimination. Normal capillary refill.     Comments: + Finkelstein's test  Skin:    General: Skin is warm and dry.  Neurological:     General: No focal deficit present.     Mental Status: She is alert and oriented to person, place, and time.  Psychiatric:        Mood and Affect: Mood normal.        Behavior: Behavior normal.     UC Treatments / Results  Labs (all labs ordered are listed, but only abnormal results are displayed) Labs Reviewed - No data to display  EKG   Radiology No results found.  Procedures Procedures (including critical care time)  Medications Ordered in UC Medications - No data to display  Initial Impression / Assessment and Plan / UC Course  I have reviewed the triage vital signs and the nursing notes.  Pertinent labs & imaging results that were  available during my care of the patient were reviewed  by me and considered in my medical decision making (see chart for details).  The patient is a 32 year old female who presents with right arm pain.  On exam, patient's symptoms started in the right thumb and radiate into the right forearm up to her elbow.  Finklestein's test is positive today.  Her symptoms are consistent with de Quervain's tenosynovitis.  We will start the patient on prednisone to see if this helps with inflammation.  Imaging was deferred as x-ray tech was not available, but was able to make a diagnosis without imaging.  Encouraged gentle range of motion, the use of ice or heat, and recommended continuing Tylenol arthritis strength 650 mg tablets to help with the pain.  Patient was advised that she most likely will need follow-up with orthopedics.  Advised patient to follow-up with Ortho care at [facility] if her symptoms do not improve. Final Clinical Impressions(s) / UC Diagnoses   Final diagnoses:  De Quervain's tenosynovitis, right     Discharge Instructions      Take medication as prescribed. Gentle range of motion and movement of the affected joint is recommended.  Try to stay mobile as this will help decrease your recovery time. May apply ice or heat as needed.  Ice for pain or swelling, heat for spasm or stiffness.  Apply for 20 minutes, remove for 1 hour, then repeat. If your symptoms do not improve, as discussed, you will most likely need follow-up with a hand specialist.  If necessary, you can follow-up with Ortho care at [facility] with Dr. Romeo Apple.  You can call them at [phone].     ED Prescriptions     Medication Sig Dispense Auth. Provider   predniSONE (DELTASONE) 20 MG tablet Take 2 tablets (40 mg total) by mouth daily with breakfast for 5 days. 10 tablet Satori Krabill-Warren, Sadie Haber, NP      PDMP not reviewed this encounter.   Abran Cantor, NP 12/17/21 1216

## 2021-12-24 ENCOUNTER — Other Ambulatory Visit: Payer: Self-pay | Admitting: *Deleted

## 2021-12-25 ENCOUNTER — Other Ambulatory Visit: Payer: Self-pay | Admitting: Adult Health

## 2021-12-27 MED ORDER — SERTRALINE HCL 50 MG PO TABS: 50.0000 mg | ORAL_TABLET | Freq: Every day | ORAL | 3 refills | Status: DC

## 2021-12-27 MED ORDER — AMLODIPINE BESYLATE 5 MG PO TABS: 5.0000 mg | ORAL_TABLET | Freq: Every day | ORAL | 3 refills | Status: DC

## 2021-12-27 MED ORDER — LOSARTAN POTASSIUM-HCTZ 100-25 MG PO TABS: 1.0000 | ORAL_TABLET | Freq: Every day | ORAL | 3 refills | Status: DC

## 2022-01-14 ENCOUNTER — Ambulatory Visit (INDEPENDENT_AMBULATORY_CARE_PROVIDER_SITE_OTHER): Payer: Commercial Managed Care - PPO | Admitting: Adult Health

## 2022-01-14 ENCOUNTER — Encounter: Payer: Self-pay | Admitting: Adult Health

## 2022-01-14 ENCOUNTER — Other Ambulatory Visit (HOSPITAL_COMMUNITY)
Admission: RE | Admit: 2022-01-14 | Discharge: 2022-01-14 | Disposition: A | Discharge status: Home / Self Care | Payer: Commercial Managed Care - PPO | Source: Ambulatory Visit | Attending: Adult Health | Admitting: Adult Health

## 2022-01-14 VITALS — BP 139/97 | HR 96 | Ht 65.0 in | Wt 295.0 lb

## 2022-01-14 DIAGNOSIS — N921 Excessive and frequent menstruation with irregular cycle: Secondary | ICD-10-CM | POA: Diagnosis not present

## 2022-01-14 DIAGNOSIS — Z124 Encounter for screening for malignant neoplasm of cervix: Secondary | ICD-10-CM | POA: Diagnosis not present

## 2022-01-14 DIAGNOSIS — I10 Essential (primary) hypertension: Secondary | ICD-10-CM | POA: Diagnosis not present

## 2022-01-14 DIAGNOSIS — F419 Anxiety disorder, unspecified: Secondary | ICD-10-CM

## 2022-01-14 DIAGNOSIS — F32A Depression, unspecified: Secondary | ICD-10-CM

## 2022-01-17 LAB — CYTOLOGY - PAP
Chlamydia: NEGATIVE
Comment: NEGATIVE
Comment: NEGATIVE
Comment: NORMAL
Diagnosis: NEGATIVE
High risk HPV: NEGATIVE
Neisseria Gonorrhea: NEGATIVE

## 2022-01-22 ENCOUNTER — Other Ambulatory Visit: Payer: Self-pay | Admitting: Adult Health

## 2022-02-20 ENCOUNTER — Other Ambulatory Visit: Payer: Self-pay | Admitting: Adult Health

## 2022-03-01 ENCOUNTER — Other Ambulatory Visit: Payer: Self-pay | Admitting: Adult Health

## 2022-03-08 ENCOUNTER — Other Ambulatory Visit: Payer: Self-pay | Admitting: Adult Health

## 2022-05-02 IMAGING — US US PELVIS COMPLETE WITH TRANSVAGINAL
1 series · 13 of 25 positions shown · non-contrast
Comparison: None

CLINICAL DATA: Menorrhagia



[Series 1: us pelvic complete with transvaginal · 13 of 88 slices shown]
[im 1/88]
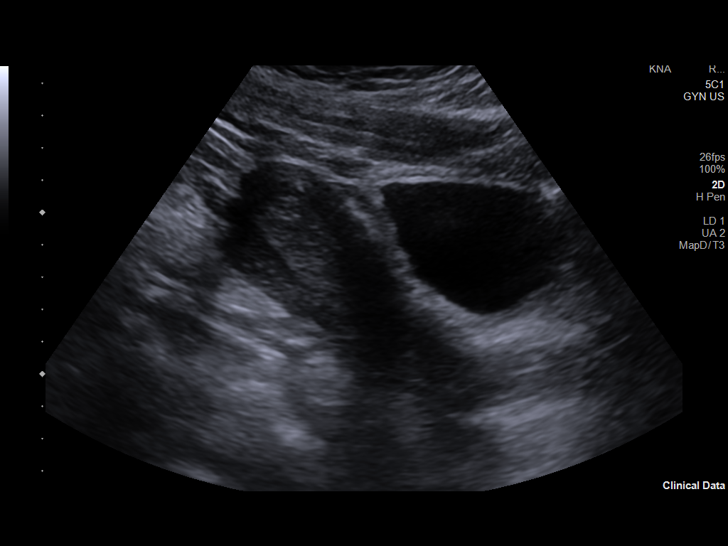
[im 8/88]
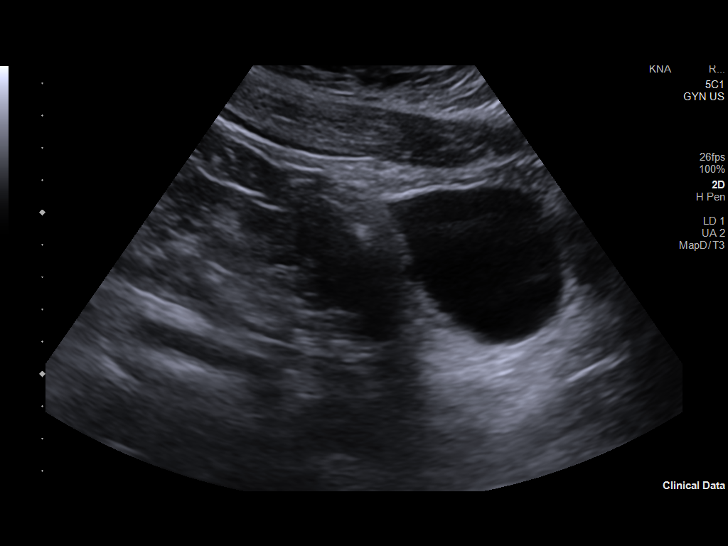
[im 15/88]
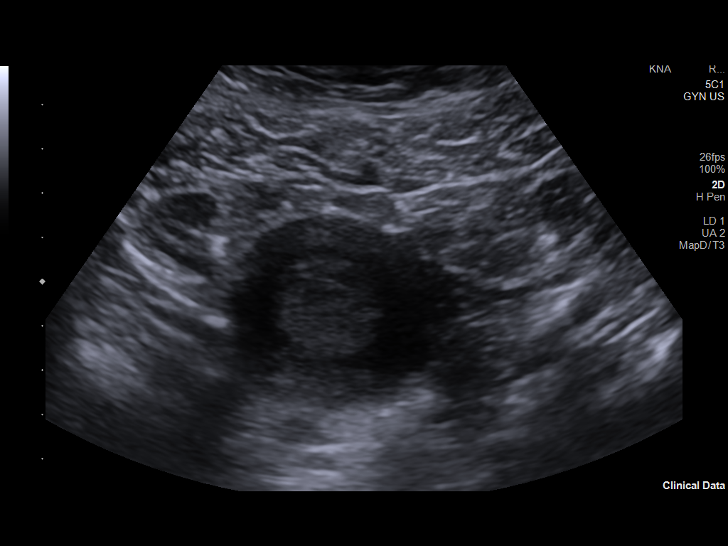
[im 22/88]
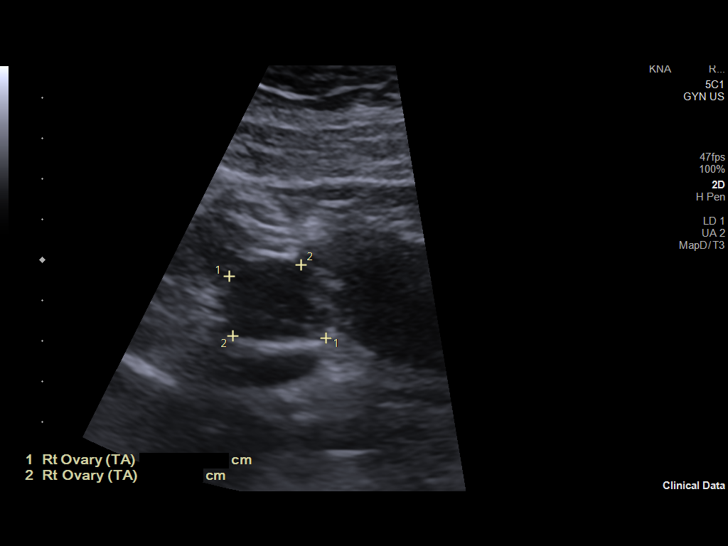
[im 30/88]
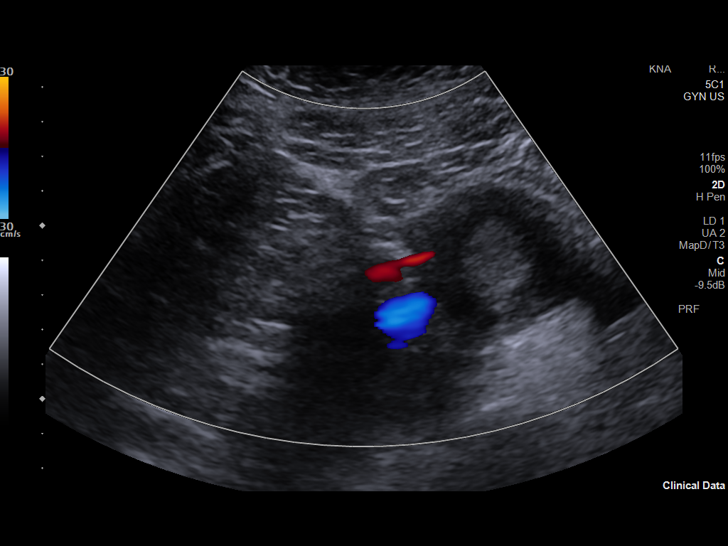
[im 37/88]
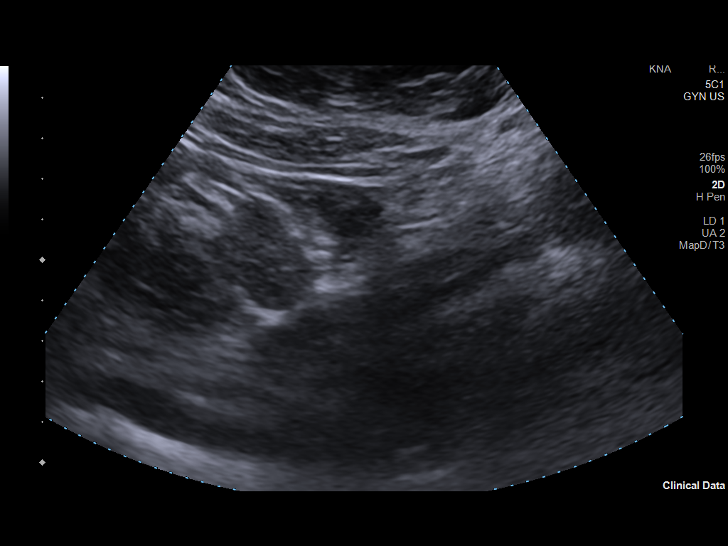
[im 44/88]
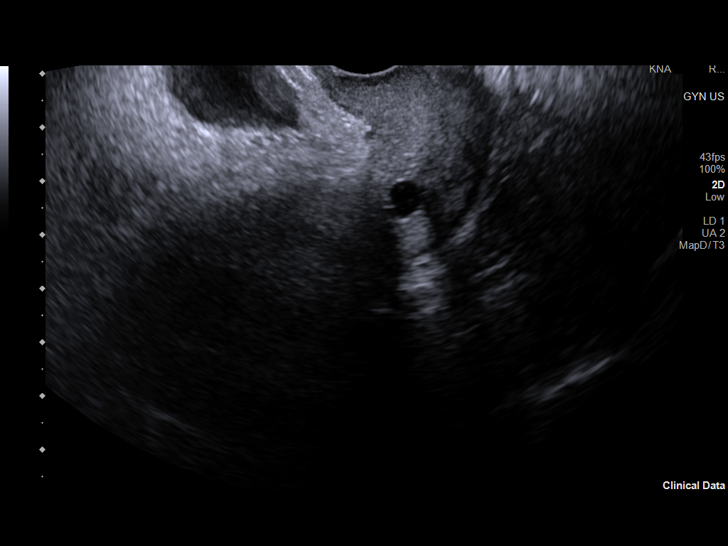
[im 51/88]
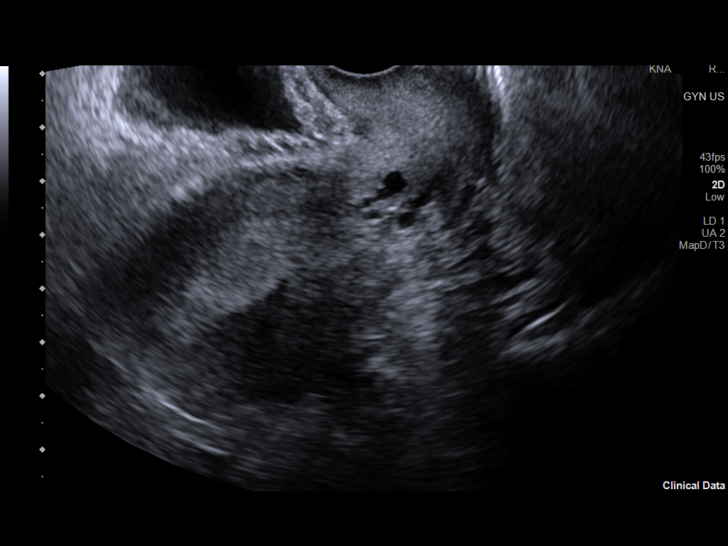
[im 59/88]
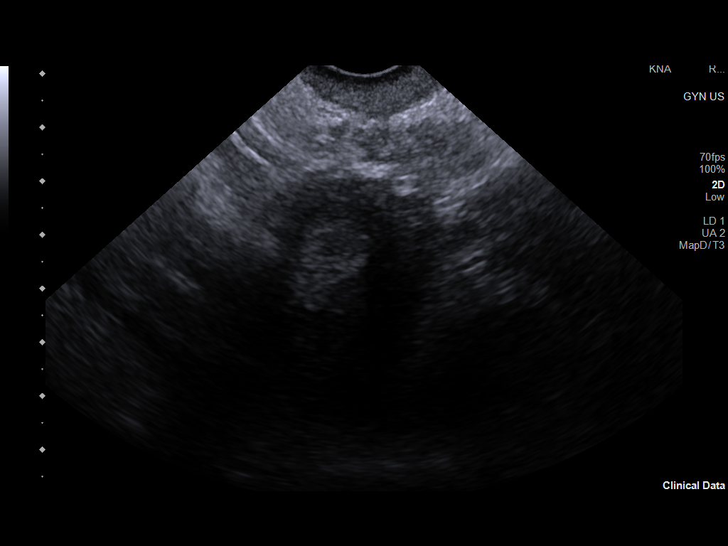
[im 66/88]
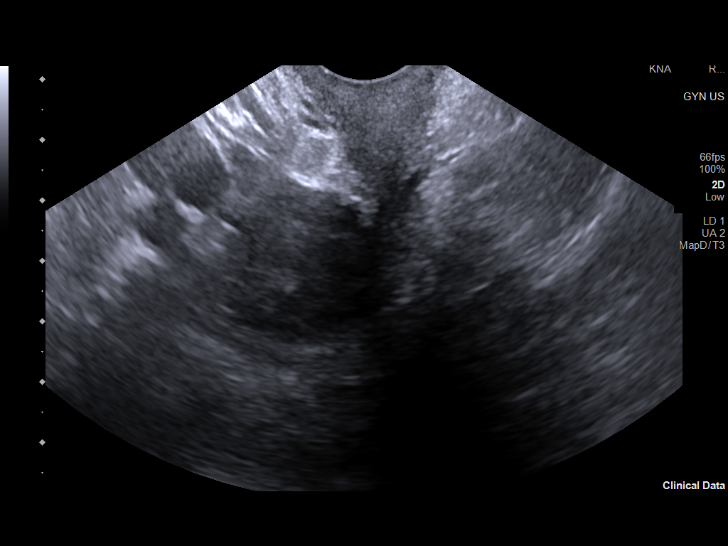
[im 73/88]
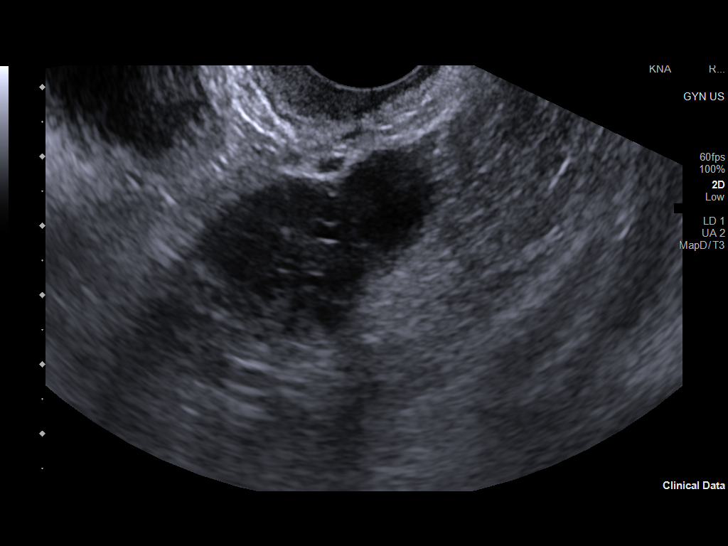
[im 80/88]
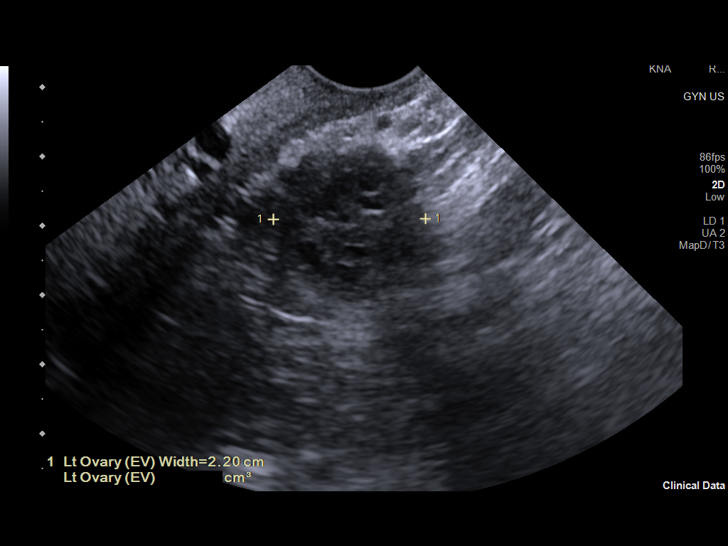
[im 88/88]
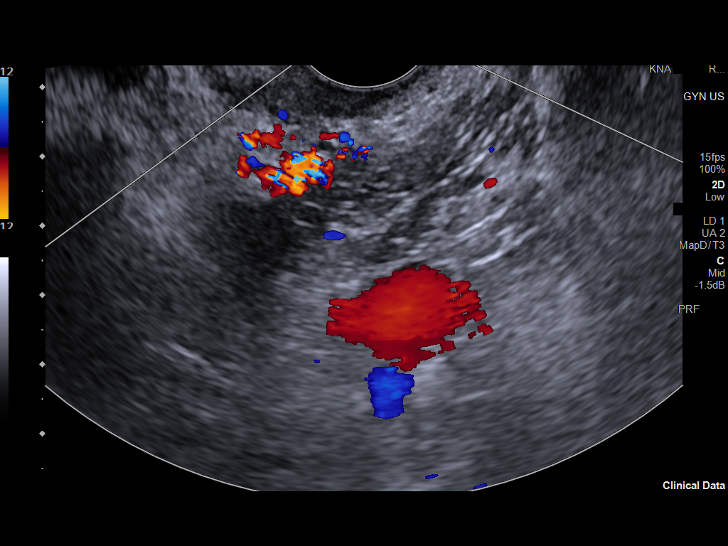

[13 of 25 positions shown; findings below may reference images not displayed]

FINDINGS: Uterus

Measurements: 9.9 x 3.6 x 5.1 cm = volume: 96 mL. No fibroids or
other mass visualized.

Endometrium

Thickness: 18 mm in thickness.  No focal abnormality visualized.

Right ovary

Measurements: 2.8 x 2.4 x 2.2 cm = volume: 8 mL. Normal
appearance/no adnexal mass.

Left ovary

Measurements: 3.5 x 2.4 x 2.2 cm = volume: 10 mL. Normal
appearance/no adnexal mass.

Other findings

No abnormal free fluid.
IMPRESSION: Endometrium thickened at 18 mm. If bleeding remains unresponsive to
hormonal or medical therapy, focal lesion work-up with
sonohysterogram should be considered. Endometrial biopsy should also
be considered in pre-menopausal patients at high risk for
endometrial carcinoma. (Ref: Radiological Reasoning: Algorithmic
Workup of Abnormal Vaginal Bleeding with Endovaginal Sonography and
Sonohysterography. AJR [id number]; 191:S68-73)

## 2022-08-09 ENCOUNTER — Ambulatory Visit: Payer: Commercial Managed Care - PPO | Admitting: Adult Health

## 2022-12-19 ENCOUNTER — Other Ambulatory Visit: Payer: Self-pay | Admitting: Adult Health

## 2023-01-10 ENCOUNTER — Other Ambulatory Visit: Payer: Self-pay | Admitting: Adult Health

## 2023-03-20 ENCOUNTER — Encounter: Payer: Self-pay | Admitting: Women's Health

## 2023-03-20 ENCOUNTER — Ambulatory Visit (INDEPENDENT_AMBULATORY_CARE_PROVIDER_SITE_OTHER): Payer: Commercial Managed Care - PPO | Admitting: Women's Health

## 2023-03-20 ENCOUNTER — Other Ambulatory Visit (HOSPITAL_COMMUNITY)
Admission: RE | Admit: 2023-03-20 | Discharge: 2023-03-20 | Disposition: A | Discharge status: Home / Self Care | Payer: Commercial Managed Care - PPO | Source: Ambulatory Visit | Attending: Women's Health | Admitting: Women's Health

## 2023-03-20 VITALS — BP 141/92 | HR 111 | Ht 66.0 in | Wt 296.2 lb

## 2023-03-20 DIAGNOSIS — Z3202 Encounter for pregnancy test, result negative: Secondary | ICD-10-CM

## 2023-03-20 DIAGNOSIS — I10 Essential (primary) hypertension: Secondary | ICD-10-CM | POA: Diagnosis not present

## 2023-03-20 DIAGNOSIS — N921 Excessive and frequent menstruation with irregular cycle: Secondary | ICD-10-CM | POA: Insufficient documentation

## 2023-03-20 LAB — POCT URINE PREGNANCY: Preg Test, Ur: NEGATIVE

## 2023-03-20 NOTE — Progress Notes (Signed)
GYN VISIT Patient name: Abagayle Romanello Lizama MRN [id number]  Date of birth: June 18, 1991 Chief Complaint:   Menstrual Problem  History of Present Illness:   Lehlani Monter Gewirtz is a 32 y.o. G0P0000 African-American female being seen today for heavy bleeding. Period started 8/2, changing saturated pad q at heaviest, large softball sized clots. Bad cramping. Feels drained, requests work note for [employer]. Last pelvic u/s for same on 05/11/21-endometrium 18mm, otherwise normal. Megace helped in past, restarted 2/day couple of weeks ago and hasn't helped yet. HTN tx by Valentina Lucks, NP, took meds this am. Was high at work check this year too.    Patient's last menstrual period was 02/24/2023 (exact date). The current method of family planning is abstinence.  Last pap 01/14/22. Results were: NILM w/ HRHPV negative     01/14/2022    8:57 AM 10/22/2021    8:58 AM 09/08/2021    9:48 AM 06/30/2021    9:42 AM 06/30/2020   10:00 AM  Depression screen PHQ 2/9  Decreased Interest 0 1 2 2  0  Down, Depressed, Hopeless 0 1 2 3  0  PHQ - 2 Score 0 2 4 5  0  Altered sleeping  0 3 2   Tired, decreased energy  1 3 2    Change in appetite  1 0 0   Feeling bad or failure about yourself   1 2 2    Trouble concentrating  2 1 0   Moving slowly or fidgety/restless  0 2 0   Suicidal thoughts  1 1 0   PHQ-9 Score  8 16 11          10/22/2021    8:59 AM 09/08/2021    9:48 AM 06/30/2021    9:42 AM  GAD 7 : Generalized Anxiety Score  Nervous, Anxious, on Edge 1 3 1   Control/stop worrying 2 2 2   Worry too much - different things 2 1 2   Trouble relaxing 1 0 1  Restless 0 0 0  Easily annoyed or irritable 1 3 3   Afraid - awful might happen 2 1 0  Total GAD 7 Score 9 10 9      Review of Systems:   Pertinent items are noted in HPI Denies fever/chills, dizziness, headaches, visual disturbances, fatigue, shortness of breath, chest pain, abdominal pain, vomiting, abnormal vaginal discharge/itching/odor/irritation, problems with  periods, bowel movements, urination, or intercourse unless otherwise stated above.  Pertinent History Reviewed:  Reviewed past medical,surgical, social, obstetrical and family history.  Reviewed problem list, medications and allergies. Physical Assessment:   Vitals:   03/20/23 1620 03/20/23 1631  BP: (!) 142/93 (!) 141/92  Pulse: (!) 104 (!) 111  Weight: 296 lb 3.2 oz (134.4 kg)   Height: 5\' 6"  (1.676 m)   Body mass index is 47.81 kg/m.       Physical Examination:   General appearance: alert, well appearing, and in no distress  Mental status: alert, oriented to person, place, and time  Skin: warm & dry   Cardiovascular: normal heart rate noted  Respiratory: normal respiratory effort, no distress  Abdomen: soft, non-tender   Pelvic: VULVA: normal appearing vulva with no masses, tenderness or lesions, VAGINA: normal appearing vagina with normal color and discharge, no lesions, small amt menstrual blood, CV swab obtained CERVIX: normal appearing cervix without discharge or lesions, UTERUS: exam limited by habitus, nontender, ADNEXA: exam limited by habitus, nontender  Extremities: no edema   Chaperone:  Freddie Apley     Results for orders placed  or performed in visit on 03/20/23 (from the past 24 hour(s))  POCT urine pregnancy   Collection Time: 03/20/23  4:50 PM  Result Value Ref Range   Preg Test, Ur Negative Negative    Assessment & Plan:  1) Menorrhagia w/ irregular cycle> CV swab, pelvic u/s (order placed, note routed to [facility] to schedule), increase megace to 3/day x5d (has plenty at home)-if not helping let us know (if helping, continue to decrease as rx'd). Will check CBC and TSH. Note given for work Wed per request  2) HTN> on losartan/hydrochlorothiazide 100/25 by JAG, discussed w/ her- pt to watch salt intake, check home bp's, f/u w/ her in 4wks  Meds: No orders of the defined types were placed in this encounter.   Orders Placed This Encounter  Procedures   US  PELVIC COMPLETE WITH TRANSVAGINAL   CBC   TSH   POCT urine pregnancy    Return in about 4 weeks (around 04/17/2023) for gyn visit w/ Irving Copas only.  Cheral Marker CNM, [facility] 03/20/2023 5:00 PM

## 2023-03-20 NOTE — Patient Instructions (Signed)
Check bp 1-2x/wk until next visit, bring log with you

## 2023-03-22 ENCOUNTER — Ambulatory Visit (HOSPITAL_BASED_OUTPATIENT_CLINIC_OR_DEPARTMENT_OTHER)
Admission: RE | Admit: 2023-03-22 | Discharge: 2023-03-22 | Disposition: A | Discharge status: Home / Self Care | Payer: Commercial Managed Care - PPO | Source: Ambulatory Visit | Attending: Women's Health | Admitting: Women's Health

## 2023-03-22 DIAGNOSIS — N921 Excessive and frequent menstruation with irregular cycle: Secondary | ICD-10-CM | POA: Diagnosis present

## 2023-03-22 LAB — CERVICOVAGINAL ANCILLARY ONLY
Bacterial Vaginitis (gardnerella): POSITIVE — AB
Candida Glabrata: NEGATIVE
Candida Vaginitis: NEGATIVE
Chlamydia: NEGATIVE
Comment: NEGATIVE
Comment: NEGATIVE
Comment: NEGATIVE
Comment: NEGATIVE
Comment: NEGATIVE
Comment: NORMAL
Neisseria Gonorrhea: NEGATIVE
Trichomonas: NEGATIVE

## 2023-03-23 ENCOUNTER — Encounter: Payer: Self-pay | Admitting: Women's Health

## 2023-03-23 DIAGNOSIS — D219 Benign neoplasm of connective and other soft tissue, unspecified: Secondary | ICD-10-CM | POA: Insufficient documentation

## 2023-03-23 LAB — CBC
Hematocrit: 37.1 % (ref 34.0–46.6)
Hemoglobin: 12.5 g/dL (ref 11.1–15.9)
MCH: 31.1 pg (ref 26.6–33.0)
MCHC: 33.7 g/dL (ref 31.5–35.7)
MCV: 92 fL (ref 79–97)
Platelets: 262 10*3/uL (ref 150–450)
RBC: 4.02 x10E6/uL (ref 3.77–5.28)
RDW: 11.7 % (ref 11.7–15.4)
WBC: 7.6 10*3/uL (ref 3.4–10.8)

## 2023-03-23 LAB — TSH: TSH: 1.24 u[IU]/mL (ref 0.450–4.500)

## 2023-03-23 MED ORDER — METRONIDAZOLE 500 MG PO TABS: 500.0000 mg | ORAL_TABLET | Freq: Two times a day (BID) | ORAL | 0 refills | Status: DC

## 2023-03-23 NOTE — Addendum Note (Signed)
Addended by: Shawna Clamp R on: 03/23/2023 10:50 AM   Modules accepted: Orders

## 2023-04-13 ENCOUNTER — Telehealth: Payer: Self-pay | Admitting: Adult Health

## 2023-04-13 NOTE — Telephone Encounter (Signed)
Needs 3  BP reading for job, trying to get on at [facility] appt Monday

## 2023-04-17 ENCOUNTER — Ambulatory Visit (INDEPENDENT_AMBULATORY_CARE_PROVIDER_SITE_OTHER): Payer: Commercial Managed Care - PPO | Admitting: Adult Health

## 2023-04-17 ENCOUNTER — Encounter: Payer: Self-pay | Admitting: Adult Health

## 2023-04-17 VITALS — BP 124/88 | HR 81 | Ht 66.0 in | Wt 293.0 lb

## 2023-04-17 DIAGNOSIS — Z0130 Encounter for examination of blood pressure without abnormal findings: Secondary | ICD-10-CM | POA: Insufficient documentation

## 2023-04-17 DIAGNOSIS — I10 Essential (primary) hypertension: Secondary | ICD-10-CM | POA: Diagnosis not present

## 2023-04-17 NOTE — Progress Notes (Signed)
Subjective:     Patient ID: Candace Maxwell, female   DOB: [birth year], 32 y.o.   MRN: [id number]  HPI Shynia is a 32 year old black female,single, G0P0, in for BP check she is trying to get on at [facility], and needs 3 BP checks on 3 different days. She is on norvasc 5 mg 1 daily and Hyzaar 100-25 mg 1 daily.      Component Value Date/Time   DIAGPAP  01/14/2022 0856    - Negative for intraepithelial lesion or malignancy (NILM)   DIAGPAP  12/31/2018 0000    NEGATIVE FOR INTRAEPITHELIAL LESIONS OR MALIGNANCY.   DIAGPAP  10/25/2016 0000    NEGATIVE FOR INTRAEPITHELIAL LESIONS OR MALIGNANCY.   HPVHIGH Negative 01/14/2022 0856   ADEQPAP  01/14/2022 0856    Satisfactory for evaluation; transformation zone component PRESENT.   ADEQPAP  12/31/2018 0000    Satisfactory for evaluation  endocervical/transformation zone component ABSENT.   ADEQPAP  10/25/2016 0000    Satisfactory for evaluation  endocervical/transformation zone component PRESENT.    PCP is Dr Gerda Diss   Review of Systems Denies any headaches Reviewed past medical,surgical, social and family history. Reviewed medications and allergies.     Objective:   Physical Exam BP 124/88 (BP Location: Left Arm, Cuff Size: Large)   Pulse 81   Ht 5\' 6"  (1.676 m)   Wt 293 lb (132.9 kg)   LMP 04/10/2023 (Exact Date)   BMI 47.29 kg/m     Skin warm and dry.  Lungs: clear to ausculation bilaterally. Cardiovascular: regular rate and rhythm.     04/17/2023    8:44 AM 01/14/2022    8:57 AM 10/22/2021    8:58 AM  Depression screen PHQ 2/9  Decreased Interest 0 0 1  Down, Depressed, Hopeless 0 0 1  PHQ - 2 Score 0 0 2  Altered sleeping   0  Tired, decreased energy   1  Change in appetite   1  Feeling bad or failure about yourself    1  Trouble concentrating   2  Moving slowly or fidgety/restless   0  Suicidal thoughts   1  PHQ-9 Score   8     Upstream - 04/17/23 0843       Pregnancy Intention Screening   Does the patient want to  become pregnant in the next year? No    Does the patient's partner want to become pregnant in the next year? No    Would the patient like to discuss contraceptive options today? No      Contraception Wrap Up   Current Method Abstinence    End Method Abstinence    Contraception Counseling Provided No             Assessment:     1. Chronic hypertension Continue meds and watch salt and sugars  2. BP check BP 124/88, note given     Plan:     Follow up in 1 day for BP check

## 2023-04-18 ENCOUNTER — Ambulatory Visit (INDEPENDENT_AMBULATORY_CARE_PROVIDER_SITE_OTHER): Payer: Commercial Managed Care - PPO | Admitting: Adult Health

## 2023-04-18 ENCOUNTER — Encounter: Payer: Self-pay | Admitting: Adult Health

## 2023-04-18 VITALS — BP 130/90 | HR 90 | Ht 66.0 in | Wt 293.0 lb

## 2023-04-18 DIAGNOSIS — I10 Essential (primary) hypertension: Secondary | ICD-10-CM | POA: Diagnosis not present

## 2023-04-18 MED ORDER — AMLODIPINE BESYLATE 10 MG PO TABS: 10.0000 mg | ORAL_TABLET | Freq: Every day | ORAL | 3 refills | Status: DC

## 2023-04-18 NOTE — Progress Notes (Signed)
Subjective:     Patient ID: Candace Maxwell, female   DOB: 1991-07-25, 32 y.o.   MRN: [id number]  HPI Candace Maxwell is a 32 year old black female,single, G0P0, in for BP check.     Component Value Date/Time   DIAGPAP  01/14/2022 0856    - Negative for intraepithelial lesion or malignancy (NILM)   DIAGPAP  12/31/2018 0000    NEGATIVE FOR INTRAEPITHELIAL LESIONS OR MALIGNANCY.   DIAGPAP  10/25/2016 0000    NEGATIVE FOR INTRAEPITHELIAL LESIONS OR MALIGNANCY.   HPVHIGH Negative 01/14/2022 0856   ADEQPAP  01/14/2022 0856    Satisfactory for evaluation; transformation zone component PRESENT.   ADEQPAP  12/31/2018 0000    Satisfactory for evaluation  endocervical/transformation zone component ABSENT.   ADEQPAP  10/25/2016 0000    Satisfactory for evaluation  endocervical/transformation zone component PRESENT.    PCP is Dr Lilyan Punt  Review of Systems For blood pressure check No complaints Reviewed past medical,surgical, social and family history. Reviewed medications and allergies.     Objective:   Physical Exam BP (!) 130/90 (BP Location: Left Arm, Patient Position: Sitting, Cuff Size: Large)   Pulse 90   Ht 5\' 6"  (1.676 m)   Wt 293 lb (132.9 kg)   LMP 04/10/2023 (Exact Date)   BMI 47.29 kg/m     Skin warm and dry. . Lungs: clear to ausculation bilaterally. Cardiovascular: regular rate and rhythm.  Fall risk is low  Upstream - 04/18/23 0956       Pregnancy Intention Screening   Does the patient want to become pregnant in the next year? Ok Either Way    Does the patient's partner want to become pregnant in the next year? Ok Either Way    Would the patient like to discuss contraceptive options today? No      Contraception Wrap Up   Current Method Abstinence    End Method No Method - Other Reason;Abstinence             Assessment:     1. Chronic hypertension Will increase Norvasc to 10 mg 1 daily and continue Hyzaar Meds ordered this encounter  Medications    amLODipine (NORVASC) 10 MG tablet    Sig: Take 1 tablet (10 mg total) by mouth daily.    Dispense:  30 tablet    Refill:  3    Order Specific Question:   Supervising Provider    Answer:   Lazaro Arms [[id number]]       Plan:     Follow up in 1 day for BP check, needs 3 in a row for new job

## 2023-04-19 ENCOUNTER — Ambulatory Visit (INDEPENDENT_AMBULATORY_CARE_PROVIDER_SITE_OTHER): Payer: Commercial Managed Care - PPO | Admitting: Adult Health

## 2023-04-19 ENCOUNTER — Encounter: Payer: Self-pay | Admitting: Adult Health

## 2023-04-19 VITALS — BP 132/90 | HR 96 | Ht 66.0 in | Wt 293.0 lb

## 2023-04-19 DIAGNOSIS — Z0130 Encounter for examination of blood pressure without abnormal findings: Secondary | ICD-10-CM | POA: Diagnosis not present

## 2023-04-19 DIAGNOSIS — I10 Essential (primary) hypertension: Secondary | ICD-10-CM | POA: Diagnosis not present

## 2023-04-19 NOTE — Progress Notes (Signed)
Subjective:     Patient ID: Candace Maxwell, female   DOB: 1990/12/05, 32 y.o.   MRN: [id number]  HPI Candace Maxwell is a 32 year old black female,single, G0P0, in for third BP check.     Component Value Date/Time   DIAGPAP  01/14/2022 0856    - Negative for intraepithelial lesion or malignancy (NILM)   DIAGPAP  12/31/2018 0000    NEGATIVE FOR INTRAEPITHELIAL LESIONS OR MALIGNANCY.   DIAGPAP  10/25/2016 0000    NEGATIVE FOR INTRAEPITHELIAL LESIONS OR MALIGNANCY.   HPVHIGH Negative 01/14/2022 0856   ADEQPAP  01/14/2022 0856    Satisfactory for evaluation; transformation zone component PRESENT.   ADEQPAP  12/31/2018 0000    Satisfactory for evaluation  endocervical/transformation zone component ABSENT.   ADEQPAP  10/25/2016 0000    Satisfactory for evaluation  endocervical/transformation zone component PRESENT.   PCP is Lilyan Punt MD  Review of Systems Denies any headaches Has period cramps today  Reviewed past medical,surgical, social and family history. Reviewed medications and allergies.     Objective:   Physical Exam BP (!) 132/90 (BP Location: Left Arm, Patient Position: Sitting, Cuff Size: Large)   Pulse 96   Ht 5\' 6"  (1.676 m)   Wt 293 lb (132.9 kg)   LMP 04/10/2023 (Exact Date)   BMI 47.29 kg/m     Skin warm and dry.  Lungs: clear to ausculation bilaterally. Cardiovascular: regular rate and rhythm.   Upstream - 04/19/23 0929       Pregnancy Intention Screening   Does the patient want to become pregnant in the next year? Ok Either Way    Does the patient's partner want to become pregnant in the next year? Ok Either Way    Would the patient like to discuss contraceptive options today? No      Contraception Wrap Up   Current Method Abstinence    End Method Abstinence    Contraception Counseling Provided No             Assessment:     1. BP check BP 132/90 left arm, note given   2. Chronic hypertension Continue taking Norvasc 10 mg 1 daily and hyzaar   Will follow  up in about 3 months for ROS and BP check     Plan:     Follow up in about 3 months for ROS and BP check with me

## 2023-05-12 ENCOUNTER — Ambulatory Visit (INDEPENDENT_AMBULATORY_CARE_PROVIDER_SITE_OTHER): Payer: Commercial Managed Care - PPO | Admitting: Obstetrics & Gynecology

## 2023-05-12 ENCOUNTER — Encounter: Payer: Self-pay | Admitting: Obstetrics & Gynecology

## 2023-05-12 VITALS — BP 147/98 | HR 105

## 2023-05-12 DIAGNOSIS — N938 Other specified abnormal uterine and vaginal bleeding: Secondary | ICD-10-CM | POA: Diagnosis not present

## 2023-05-12 DIAGNOSIS — N803 Endometriosis of pelvic peritoneum, unspecified: Secondary | ICD-10-CM

## 2023-05-12 MED ORDER — MYFEMBREE 40-1-0.5 MG PO TABS: 1.0000 | ORAL_TABLET | Freq: Every day | ORAL | 11 refills | Status: DC

## 2023-05-12 MED ORDER — MEGESTROL ACETATE 40 MG PO TABS: ORAL_TABLET | ORAL | 0 refills | Status: DC

## 2023-05-12 NOTE — Progress Notes (Signed)
Chief Complaint  Patient presents with   Pelvic Pain    Irregular periods       32 y.o. G0P0000 Patient's last menstrual period was 04/10/2023 (exact date). The current method of family planning is abstinence.  Outpatient Encounter Medications as of 05/12/2023  Medication Sig   albuterol (PROVENTIL HFA;VENTOLIN HFA) 108 (90 Base) MCG/ACT inhaler Inhale 2 puffs into the lungs every 6 (six) hours as needed for wheezing or shortness of breath.   amLODipine (NORVASC) 10 MG tablet Take 1 tablet (10 mg total) by mouth daily.   diphenhydrAMINE (BENADRYL) 25 mg capsule Take 25 mg by mouth every 6 (six) hours as needed.   fluticasone (FLONASE) 50 MCG/ACT nasal spray Place 2 sprays into both nostrils daily.   losartan-hydrochlorothiazide (HYZAAR) 100-25 MG tablet TAKE 1 TABLET DAILY   megestrol (MEGACE) 40 MG tablet 3 tablets a day for 5 days, 2 tablets a day for 5 days then 1 tablet daily   Relugolix-Estradiol-Norethind (MYFEMBREE) 40-1-0.5 MG TABS Take 1 tablet by mouth daily.   sertraline (ZOLOFT) 50 MG tablet TAKE 1 TABLET DAILY   [DISCONTINUED] megestrol (MEGACE) 40 MG tablet TAKE 1-2 TABLETS BY MOUTH EVERY DAY UNTIL BLEEDING STOPS   nystatin-triamcinolone ointment (MYCOLOG) Apply 1 Application topically. (Patient not taking: Reported on 05/12/2023)   No facility-administered encounter medications on file as of 05/12/2023.    Subjective Pt with long standing history of DUB, PCOS type clinical picture overall However new is pelvic pain even when she isn't bleeding Recent pelvic gyn sonogram was normal No recent sex so unsure about dyspareunia On intermittent megestrol for management of DUB  Past Medical History:  Diagnosis Date   Hypertension    Mental disorder     Past Surgical History:  Procedure Laterality Date   CHOLECYSTECTOMY  1 1 2012   TONSILLECTOMY AND ADENOIDECTOMY Bilateral 04/22/2019   Procedure: TONSILLECTOMY AND ADENOIDECTOMY;  Surgeon: Newman Pies, MD;   Location: [facility];  Service: ENT;  Laterality: Bilateral;    OB History     Gravida  0   Para  0   Term  0   Preterm  0   AB  0   Living  0      SAB  0   IAB  0   Ectopic  0   Multiple  0   Live Births  0           Allergies  Allergen Reactions   Cauliflower [Brassica Oleracea] Hives, Itching and Rash   Cefdinir Hives    Social History   Socioeconomic History   Marital status: Single    Spouse name: Not on file   Number of children: Not on file   Years of education: Not on file   Highest education level: Not on file  Occupational History   Not on file  Tobacco Use   Smoking status: Never   Smokeless tobacco: Never  Vaping Use   Vaping status: Never Used  Substance and Sexual Activity   Alcohol use: Yes    Comment: occasionally   Drug use: Never   Sexual activity: Not Currently    Birth control/protection: None, Abstinence  Other Topics Concern   Not on file  Social History Narrative   Not on file   Social Determinants of Health   Financial Resource Strain: Low Risk  (06/30/2021)   Overall Financial Resource Strain (CARDIA)    Difficulty of Paying Living Expenses: Not hard at all  Food Insecurity: No Food Insecurity (06/30/2021)   Hunger Vital Sign    Worried About Running Out of Food in the Last Year: Never true    Ran Out of Food in the Last Year: Never true  Transportation Needs: No Transportation Needs (06/30/2021)   PRAPARE - Administrator, Civil Service (Medical): No    Lack of Transportation (Non-Medical): No  Physical Activity: Insufficiently Active (06/30/2021)   Exercise Vital Sign    Days of Exercise per Week: 3 days    Minutes of Exercise per Session: 20 min  Stress: Stress Concern Present (06/30/2021)   [employer] of Occupational Health - Occupational Stress Questionnaire    Feeling of Stress : Rather much  Social Connections: Moderately Isolated (06/30/2021)   Social Connection and  Isolation Panel [NHANES]    Frequency of Communication with Friends and Family: More than three times a week    Frequency of Social Gatherings with Friends and Family: Once a week    Attends Religious Services: More than 4 times per year    Active Member of [employer] or Organizations: No    Attends Engineer, structural: Never    Marital Status: Never married    Family History  Problem Relation Age of Onset   Uterine cancer Paternal Grandmother    Alzheimer's disease Paternal Grandmother    Hypertension Father    Healthy Father    Hypertension Mother    Healthy Mother    Hypertension Sister     Medications:       Current Outpatient Medications:    albuterol (PROVENTIL HFA;VENTOLIN HFA) 108 (90 Base) MCG/ACT inhaler, Inhale 2 puffs into the lungs every 6 (six) hours as needed for wheezing or shortness of breath., Disp: 1 Inhaler, Rfl: 0   amLODipine (NORVASC) 10 MG tablet, Take 1 tablet (10 mg total) by mouth daily., Disp: 30 tablet, Rfl: 3   diphenhydrAMINE (BENADRYL) 25 mg capsule, Take 25 mg by mouth every 6 (six) hours as needed., Disp: , Rfl:    fluticasone (FLONASE) 50 MCG/ACT nasal spray, Place 2 sprays into both nostrils daily., Disp: 16 g, Rfl: 1   losartan-hydrochlorothiazide (HYZAAR) 100-25 MG tablet, TAKE 1 TABLET DAILY, Disp: 90 tablet, Rfl: 3   megestrol (MEGACE) 40 MG tablet, 3 tablets a day for 5 days, 2 tablets a day for 5 days then 1 tablet daily, Disp: 45 tablet, Rfl: 0   Relugolix-Estradiol-Norethind (MYFEMBREE) 40-1-0.5 MG TABS, Take 1 tablet by mouth daily., Disp: 90 tablet, Rfl: 11   sertraline (ZOLOFT) 50 MG tablet, TAKE 1 TABLET DAILY, Disp: 90 tablet, Rfl: 3   nystatin-triamcinolone ointment (MYCOLOG), Apply 1 Application topically. (Patient not taking: Reported on 05/12/2023), Disp: , Rfl:   Objective Blood pressure (!) 147/98, pulse (!) 105, last menstrual period 04/10/2023.  General [facility] female NAD Vulva:  normal appearing vulva with no masses,  tenderness or lesions Vagina:  normal mucosa, no discharge Cervix:  Normal no lesions Uterus:  normal size, contour, position, consistency, mobility, non-tender Adnexa: ovaries:present,  normal adnexa in size, nontender and no masses   Pertinent ROS No burning with urination, frequency or urgency No nausea, vomiting or diarrhea Nor fever chills or other constitutional symptoms   Labs or studies US PELVIC COMPLETE WITH TRANSVAGINAL  Result Date: 03/22/2023 CLINICAL DATA:  menorrhagia w/ irregular cycle EXAM: ULTRASOUND OF PELVIS TECHNIQUE: Transabdominal and transvaginalultrasound examination of the pelvis was performed including evaluation of the uterus, ovaries, adnexal regions, and pelvic cul-de-sac. COMPARISON:  05/11/2021.  FINDINGS: Uterusanteverted, 10 x 6 x 4 cm. The endometrium unremarkable, 0.7 cm. The uterine cavity is empty. Posterior intramural fibroid measures 1.8 cm. Right ovary Unremarkable, 2.2 x 2.2 x 1.8 cm. Left ovary Unremarkable, 3.0 x 2.5 x 2.3 cm. Images of the adnexae demonstrated no masses or fluid collections IMPRESSION: Small intramural fibroid. Otherwise unremarkable examination of the pelvis. Electronically Signed   By: Layla Maw M.D.   On: 03/22/2023 23:31       Impression + Management Plan: Diagnoses this Encounter::   ICD-10-CM   1. Endometriosis, pelvic peritoneum  N80.30     2. DUB (dysfunctional uterine bleeding)  N93.8      1 cycle of high does megestrol algorithm, off 1 week then start myfembree for presumptive diagnosis of endometriosis but will also manage her HMB/DUB   Medications prescribed during  this encounter: Meds ordered this encounter  Medications   megestrol (MEGACE) 40 MG tablet    Sig: 3 tablets a day for 5 days, 2 tablets a day for 5 days then 1 tablet daily    Dispense:  45 tablet    Refill:  0   Relugolix-Estradiol-Norethind (MYFEMBREE) 40-1-0.5 MG TABS    Sig: Take 1 tablet by mouth daily.    Dispense:  90 tablet     Refill:  11    Labs or Scans Ordered during this encounter: No orders of the defined types were placed in this encounter.     Follow up Return in about 4 months (around 09/12/2023) for Follow up, with Dr Despina Hidden.

## 2023-05-18 ENCOUNTER — Encounter: Payer: Self-pay | Admitting: *Deleted

## 2023-07-14 ENCOUNTER — Other Ambulatory Visit: Payer: Self-pay | Admitting: Adult Health

## 2023-09-08 ENCOUNTER — Other Ambulatory Visit: Payer: Self-pay | Admitting: *Deleted

## 2023-09-08 MED ORDER — MYFEMBREE 40-1-0.5 MG PO TABS: 1.0000 | ORAL_TABLET | Freq: Every day | ORAL | 11 refills | Status: AC

## 2023-09-08 MED ORDER — SERTRALINE HCL 50 MG PO TABS: 50.0000 mg | ORAL_TABLET | Freq: Every day | ORAL | 3 refills | Status: AC

## 2023-09-08 MED ORDER — AMLODIPINE BESYLATE 10 MG PO TABS: 10.0000 mg | ORAL_TABLET | Freq: Every day | ORAL | 1 refills | Status: DC

## 2023-09-08 MED ORDER — LOSARTAN POTASSIUM-HCTZ 100-25 MG PO TABS: 1.0000 | ORAL_TABLET | Freq: Every day | ORAL | 3 refills | Status: DC

## 2023-09-25 ENCOUNTER — Telehealth: Payer: Self-pay | Admitting: *Deleted

## 2023-09-25 ENCOUNTER — Encounter: Payer: Self-pay | Admitting: *Deleted

## 2023-09-25 NOTE — Telephone Encounter (Signed)
 Pt aware she needs to see Dr. Despina Hidden before Megace can be refilled. Pt will see Dr. Despina Hidden on 3/13 at 4:10. Pt voiced understanding. JSY

## 2023-09-25 NOTE — Telephone Encounter (Signed)
 Pt needs refills on Megace. She takes it prn. Thanks! JSY

## 2023-10-05 ENCOUNTER — Ambulatory Visit: Admitting: Obstetrics & Gynecology

## 2023-11-19 ENCOUNTER — Other Ambulatory Visit: Payer: Self-pay | Admitting: Adult Health

## 2024-07-08 ENCOUNTER — Encounter: Payer: Self-pay | Admitting: Women's Health

## 2024-07-08 ENCOUNTER — Other Ambulatory Visit: Payer: Self-pay | Admitting: Adult Health
